# Patient Record
Sex: Male | Born: 1990 | Race: Black or African American | Hispanic: No | Marital: Single | Smoking: Current every day smoker
Health system: Southern US, Community
[De-identification: ages and names within clinical notes are randomized; demographics above are authoritative.]

## PROBLEM LIST (undated history)

## (undated) DIAGNOSIS — J45909 Unspecified asthma, uncomplicated: Secondary | ICD-10-CM

## (undated) HISTORY — DX: Unspecified asthma, uncomplicated: J45.909

---
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
## 2011-01-12 ENCOUNTER — Ambulatory Visit: Payer: Self-pay | Admitting: Family Medicine

## 2015-07-27 ENCOUNTER — Emergency Department: Payer: BLUE CROSS/BLUE SHIELD

## 2015-07-27 ENCOUNTER — Encounter: Payer: Self-pay | Admitting: Emergency Medicine

## 2015-07-27 ENCOUNTER — Emergency Department
Admission: EM | Admit: 2015-07-27 | Discharge: 2015-07-27 | Disposition: A | Discharge status: Home / Self Care | Payer: BLUE CROSS/BLUE SHIELD | Attending: Emergency Medicine | Admitting: Emergency Medicine

## 2015-07-27 DIAGNOSIS — L02416 Cutaneous abscess of left lower limb: Secondary | ICD-10-CM | POA: Diagnosis present

## 2015-07-27 DIAGNOSIS — L03116 Cellulitis of left lower limb: Secondary | ICD-10-CM | POA: Insufficient documentation

## 2015-07-27 DIAGNOSIS — Z720 Tobacco use: Secondary | ICD-10-CM | POA: Insufficient documentation

## 2015-07-27 MED ORDER — SULFAMETHOXAZOLE-TRIMETHOPRIM 800-160 MG PO TABS: 1.0000 | ORAL_TABLET | Freq: Two times a day (BID) | ORAL | Status: DC

## 2015-07-27 MED ORDER — IBUPROFEN 800 MG PO TABS: 800.0000 mg | ORAL_TABLET | Freq: Three times a day (TID) | ORAL | Status: DC

## 2015-07-27 NOTE — ED Provider Notes (Signed)
[facility] Emergency Department Provider Note  ____________________________________________  Time seen:  4:23 PM  I have reviewed the triage vital signs and the nursing notes.   HISTORY  Chief Complaint Abscess   HPI Farrel Guimond is a 24 y.o. male is here with complaint of left lower leg pain 1 month. He states that he may have "spider bite" to his leg but did not actually see it. He states the area has been swollen and warm to touch for several days. He is not taking any medication for it and this is the first time he's been seen for this complaint. He denies any previous injury to his leg. He denies any fever or chills, nausea or vomiting. He rates his pain is 7 out of 10.   History reviewed. No pertinent past medical history.  There are no active problems to display for this patient.   History reviewed. No pertinent past surgical history.  Current Outpatient Rx  Name  Route  Sig  Dispense  Refill  . ibuprofen (ADVIL,MOTRIN) 800 MG tablet   Oral   Take 1 tablet (800 mg total) by mouth 3 (three) times daily.   30 tablet   0   . sulfamethoxazole-trimethoprim (BACTRIM DS,SEPTRA DS) 800-160 MG per tablet   Oral   Take 1 tablet by mouth 2 (two) times daily.   20 tablet   0     Allergies Review of patient's allergies indicates no known allergies.  No family history on file.  Social History History  Substance Use Topics  . Smoking status: Current Every Day Smoker    Types: Cigarettes  . Smokeless tobacco: Not on file  . Alcohol Use: 1.2 oz/week    2 Cans of beer per week    Review of Systems Constitutional: No fever/chills ENT: No sore throat. Cardiovascular: Denies chest pain. Respiratory: Denies shortness of breath. Gastrointestinal: No abdominal pain.  No nausea, no vomiting Genitourinary: Negative for dysuria. Musculoskeletal: Negative for back pain. Skin: Negative for rash. Neurological: Negative for headaches, focal  weakness or numbness.  10-point ROS otherwise negative.  ____________________________________________   PHYSICAL EXAM:  VITAL SIGNS: ED Triage Vitals  Enc Vitals Group     BP 07/27/15 1559 146/87 mmHg     Pulse Rate 07/27/15 1559 98     Resp 07/27/15 1559 16     Temp 07/27/15 1559 98.2 F (36.8 C)     Temp Source 07/27/15 1559 Oral     SpO2 07/27/15 1559 98 %     Weight 07/27/15 1559 220 lb (99.791 kg)     Height 07/27/15 1559 5\' 10"  (1.778 m)     Head Cir --      Peak Flow --      Pain Score 07/27/15 1559 7     Pain Loc --      Pain Edu? --      Excl. in GC? --     Constitutional: Alert and oriented. Well appearing and in no acute distress. Eyes: Conjunctivae are normal. PERRL. EOMI. Head: Atraumatic. Nose: No congestion/rhinnorhea. Neck: No stridor.   Cardiovascular: Normal rate, regular rhythm. Grossly normal heart sounds.  Good peripheral circulation. Respiratory: Normal respiratory effort.  No retractions. Lungs CTAB. Gastrointestinal: Soft and nontender. No distention. Musculoskeletal: No lower extremity tenderness nor edema.  No joint effusions. We'll aspect of the left lower leg, there is a very firm area measuring approximately 8 cm without fluctuance. Area is warm to touch and tender. Neurologic Normal speech  and language. No gross focal neurologic deficits are appreciated. No gait instability. Skin:  Skin is warm, dry and intact. No rash noted. Psychiatric: Mood and affect are normal. Speech and behavior are normal.  ____________________________________________   LABS (all labs ordered are listed, but only abnormal results are displayed)  Labs Reviewed - No data to display  RADIOLOGY  X-ray of left tib-fib shows no bony abnormality or foreign body. I, Tommi Rumps, personally viewed and evaluated these images as part of my medical decision making.  ____________________________________________   PROCEDURES  Procedure(s) performed:  None  Critical Care performed: No  ____________________________________________   INITIAL IMPRESSION / ASSESSMENT AND PLAN / ED COURSE  Pertinent labs & imaging results that were available during my care of the patient were reviewed by me and considered in my medical decision making (see chart for details  at this time we will treat for cellulitis. Patient was placed on Septra DS for infection and ibuprofen as needed for pain and inflammation. He will use warm compresses to the area and if any fever, chills or severe worsening he is to return to the emergency room.   FINAL CLINICAL IMPRESSION(S) / ED DIAGNOSES  Final diagnoses:  Cellulitis of leg without foot, left      Tommi Rumps, PA-C 07/27/15 1735  Myrna Blazer, MD 07/27/15 [phone]

## 2015-07-27 NOTE — Discharge Instructions (Signed)

## 2015-07-27 NOTE — ED Notes (Signed)
Pt reports "spider bite" to left lower leg x1 month. Area is swollen and warm to touch.

## 2016-01-17 IMAGING — CR DG TIBIA/FIBULA 2V*L*
1 series · 4 of 4 positions shown · non-contrast
Comparison: None.

CLINICAL DATA: Pain in mid calf since spider bite 1 month ago

EXAM:
LEFT TIBIA AND FIBULA - 2 VIEW

[Series 1: ap · 0.17mm/px · 4 of 4 slices shown]
[im 1/4]
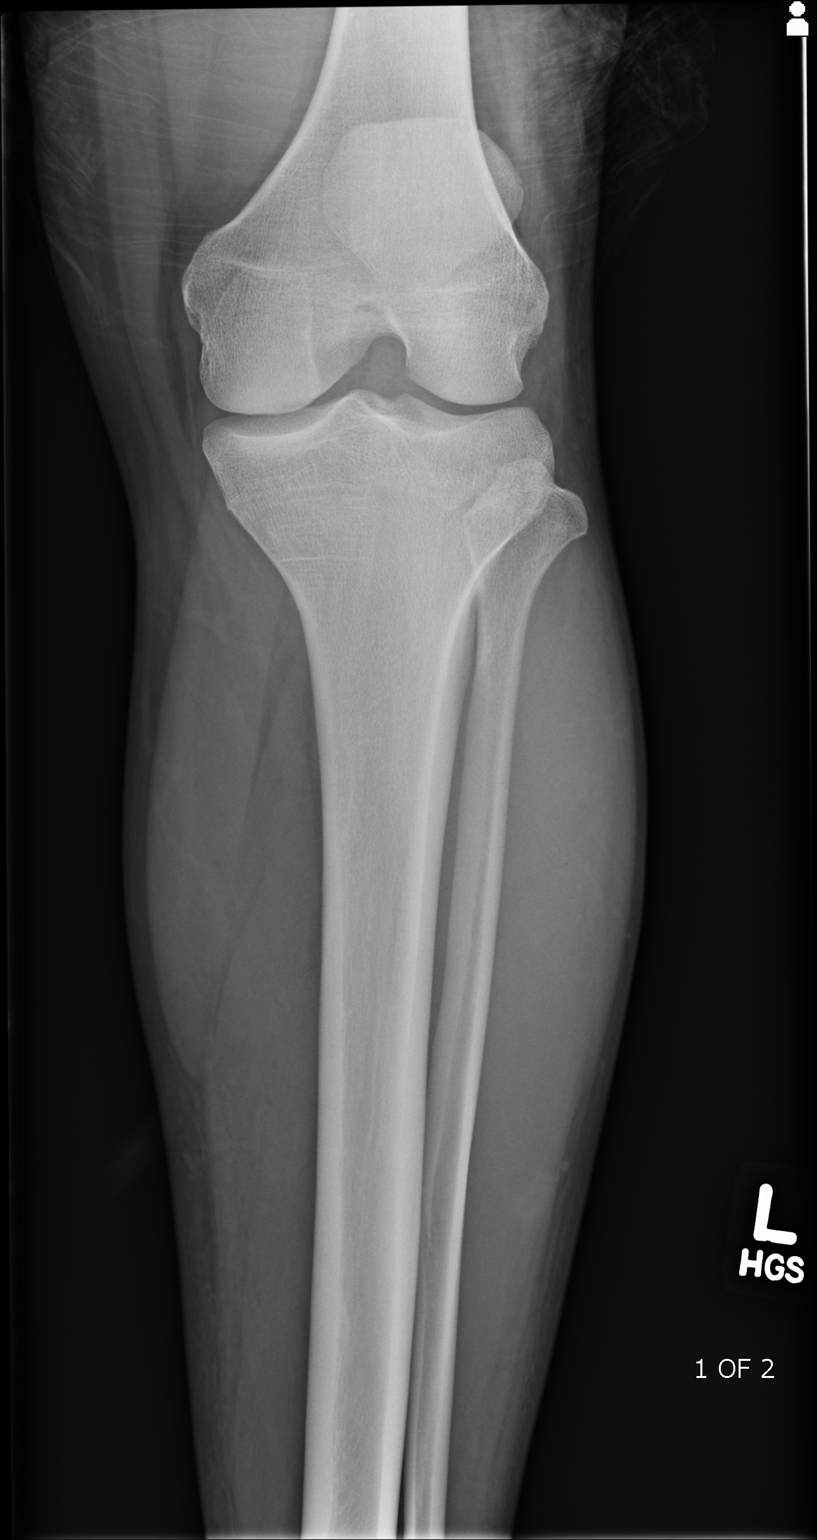
[im 2/4]
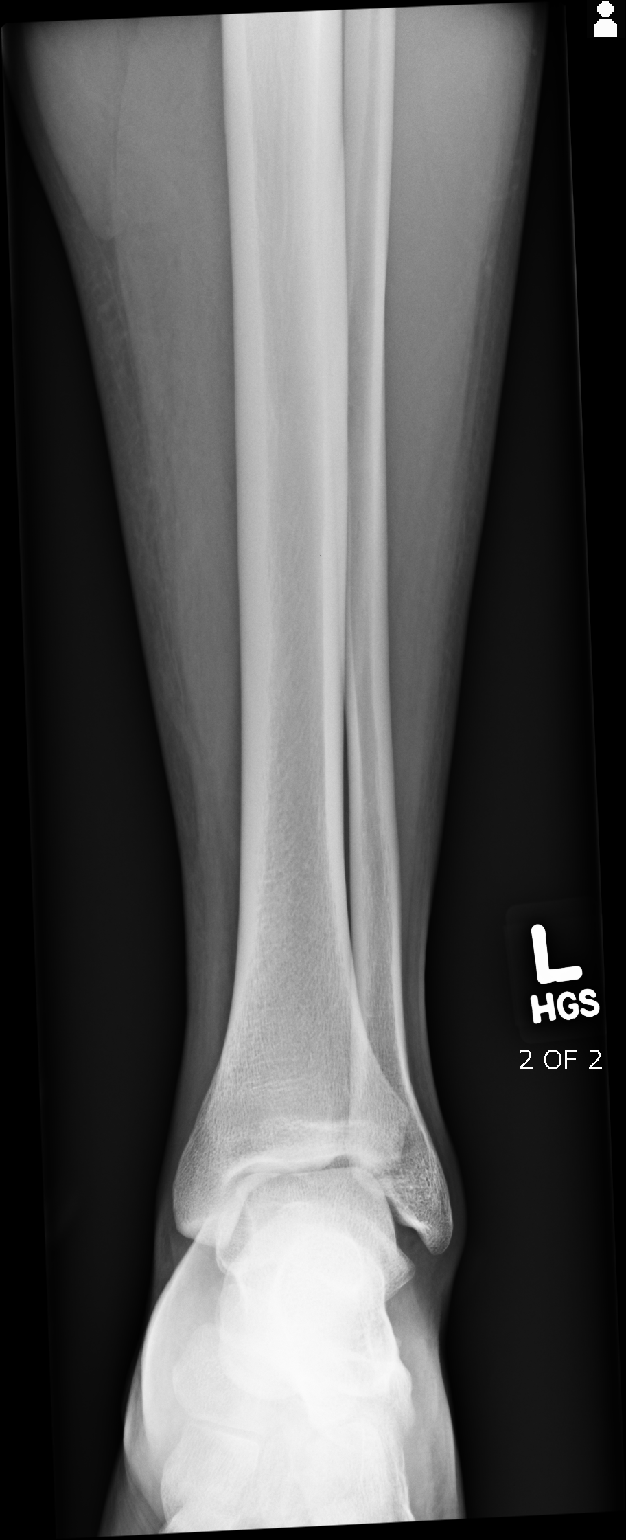
[im 3/4]
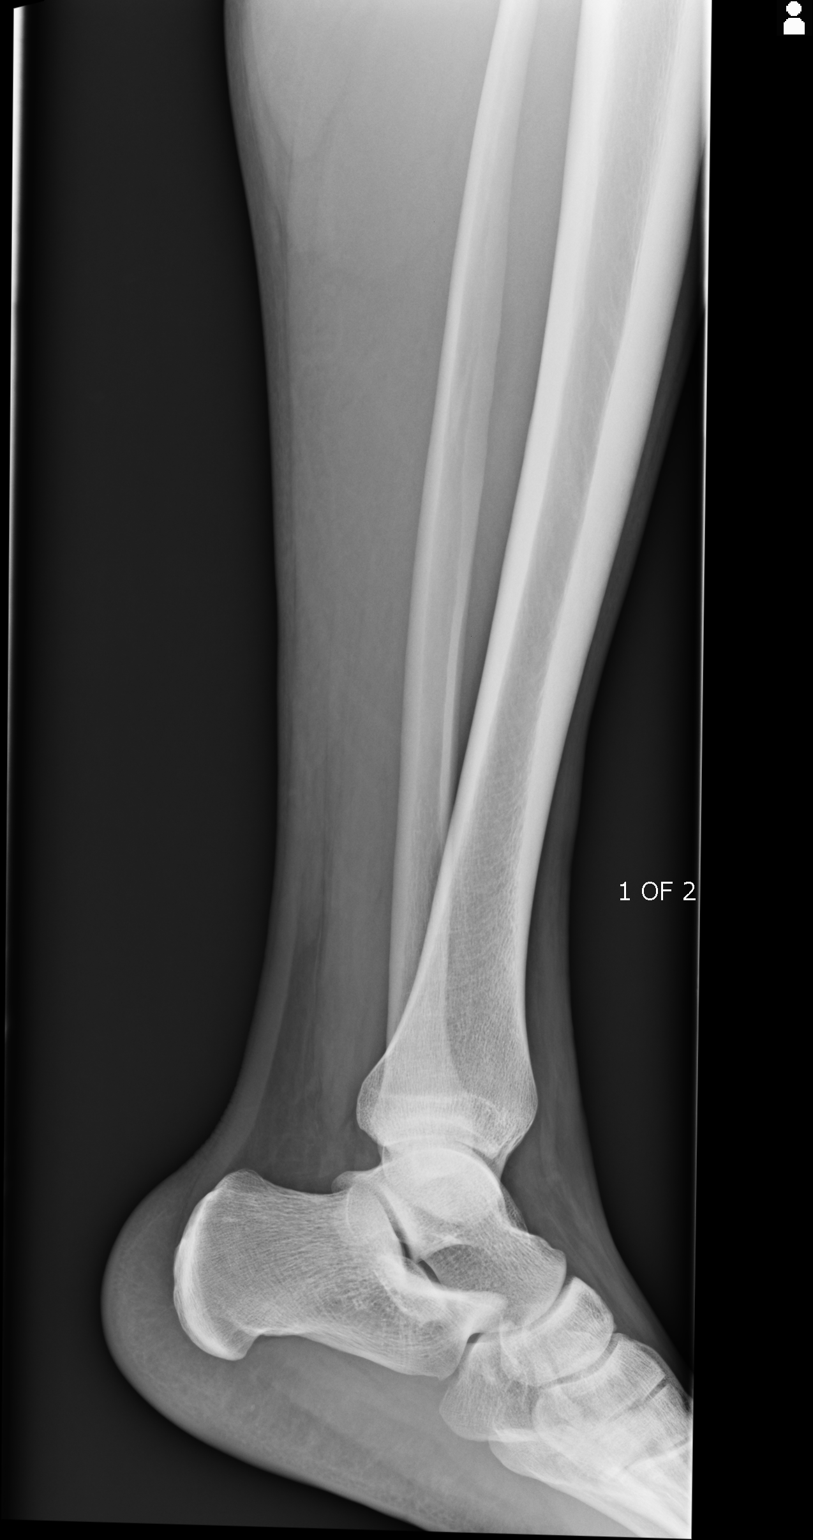
[im 4/4]
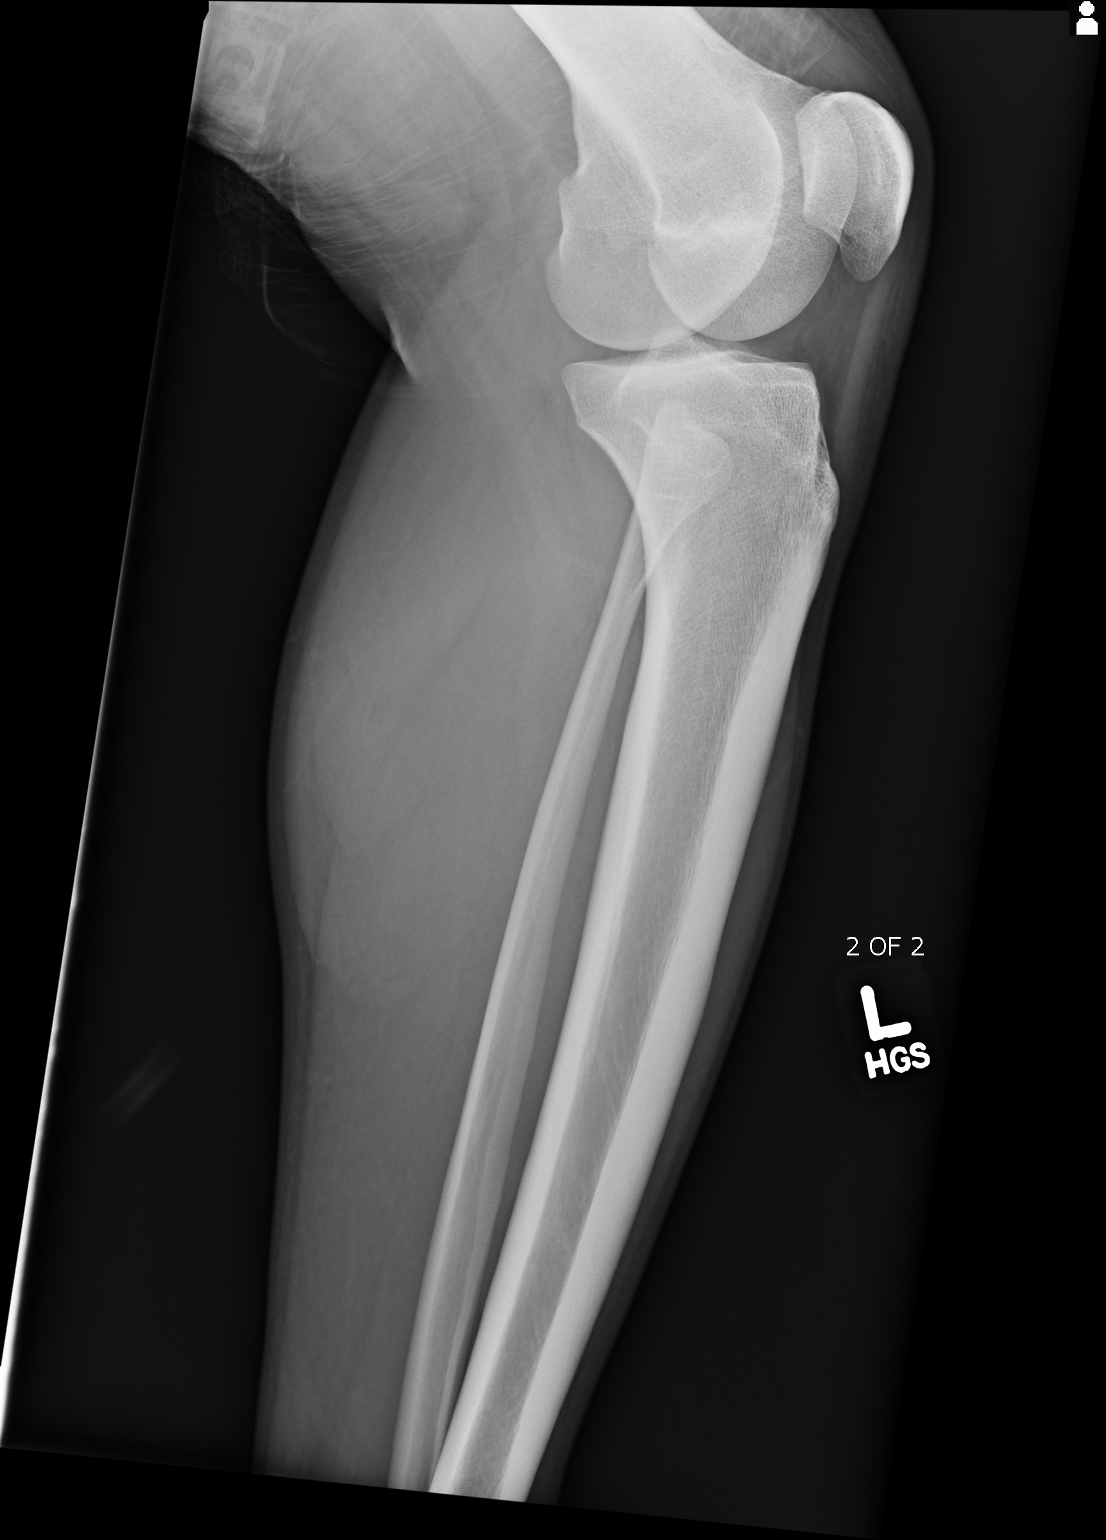

[4 of 4 positions shown; findings below may reference images not displayed]

FINDINGS: No fracture or dislocation is seen.

The joint spaces are preserved.

The visualized soft tissues are unremarkable.

No radiopaque foreign body is seen.
IMPRESSION: No acute osseous abnormality is seen.

## 2018-12-29 DIAGNOSIS — F419 Anxiety disorder, unspecified: Secondary | ICD-10-CM | POA: Insufficient documentation

## 2018-12-29 DIAGNOSIS — E7800 Pure hypercholesterolemia, unspecified: Secondary | ICD-10-CM | POA: Insufficient documentation

## 2019-02-27 DIAGNOSIS — N451 Epididymitis: Secondary | ICD-10-CM | POA: Insufficient documentation

## 2019-02-27 HISTORY — DX: Epididymitis: N45.1

## 2023-07-19 ENCOUNTER — Ambulatory Visit (INDEPENDENT_AMBULATORY_CARE_PROVIDER_SITE_OTHER): Payer: 59 | Admitting: Family Medicine

## 2023-07-19 ENCOUNTER — Encounter: Payer: Self-pay | Admitting: Emergency Medicine

## 2023-07-19 ENCOUNTER — Emergency Department: Payer: 59

## 2023-07-19 ENCOUNTER — Other Ambulatory Visit: Payer: Self-pay

## 2023-07-19 ENCOUNTER — Emergency Department
Admission: EM | Admit: 2023-07-19 | Discharge: 2023-07-19 | Disposition: A | Discharge status: Home / Self Care | Payer: 59 | Attending: Emergency Medicine | Admitting: Emergency Medicine

## 2023-07-19 ENCOUNTER — Encounter: Payer: Self-pay | Admitting: Family Medicine

## 2023-07-19 VITALS — BP 144/88 | HR 91 | Temp 98.6°F | Ht 70.0 in | Wt 222.0 lb

## 2023-07-19 DIAGNOSIS — Z7689 Persons encountering health services in other specified circumstances: Secondary | ICD-10-CM

## 2023-07-19 DIAGNOSIS — R9431 Abnormal electrocardiogram [ECG] [EKG]: Secondary | ICD-10-CM

## 2023-07-19 DIAGNOSIS — R072 Precordial pain: Secondary | ICD-10-CM

## 2023-07-19 DIAGNOSIS — F321 Major depressive disorder, single episode, moderate: Secondary | ICD-10-CM

## 2023-07-19 DIAGNOSIS — F17200 Nicotine dependence, unspecified, uncomplicated: Secondary | ICD-10-CM | POA: Diagnosis not present

## 2023-07-19 DIAGNOSIS — R079 Chest pain, unspecified: Secondary | ICD-10-CM | POA: Diagnosis present

## 2023-07-19 DIAGNOSIS — F339 Major depressive disorder, recurrent, unspecified: Secondary | ICD-10-CM

## 2023-07-19 DIAGNOSIS — R030 Elevated blood-pressure reading, without diagnosis of hypertension: Secondary | ICD-10-CM

## 2023-07-19 LAB — CBC
HCT: 44.1 % (ref 39.0–52.0)
Hemoglobin: 14.5 g/dL (ref 13.0–17.0)
MCH: 27.6 pg (ref 26.0–34.0)
MCHC: 32.9 g/dL (ref 30.0–36.0)
MCV: 83.8 fL (ref 80.0–100.0)
Platelets: 280 10*3/uL (ref 150–400)
RBC: 5.26 MIL/uL (ref 4.22–5.81)
RDW: 14.1 % (ref 11.5–15.5)
WBC: 7.1 10*3/uL (ref 4.0–10.5)
nRBC: 0 % (ref 0.0–0.2)

## 2023-07-19 LAB — BASIC METABOLIC PANEL
Anion gap: 9 (ref 5–15)
BUN: 14 mg/dL (ref 6–20)
CO2: 27 mmol/L (ref 22–32)
Calcium: 9.6 mg/dL (ref 8.9–10.3)
Chloride: 100 mmol/L (ref 98–111)
Creatinine, Ser: 1.1 mg/dL (ref 0.61–1.24)
GFR, Estimated: >60 mL/min (ref >60)
Glucose, Bld: 103 mg/dL — ABNORMAL HIGH (ref 70–99)
Potassium: 3.7 mmol/L (ref 3.5–5.1)
Sodium: 136 mmol/L (ref 135–145)

## 2023-07-19 LAB — TROPONIN I (HIGH SENSITIVITY): Troponin I (High Sensitivity): 6 ng/L (ref <18)

## 2023-07-19 MED ORDER — BUPROPION HCL ER (SR) 150 MG PO TB12: ORAL_TABLET | ORAL | 0 refills | Status: DC

## 2023-07-19 NOTE — ED Triage Notes (Signed)
Patient to ED via POV for chest pain- none at this time though. Last had CP last week. Came to ED due to PCP sending him due to abnormal EKG and showing signs of "a pre-heart attack." NAD noted.

## 2023-07-19 NOTE — Progress Notes (Unsigned)
New patient visit   Patient: Justin Small   DOB: [birth year]   31 y.o. Male  MRN: [id number] Visit Date: 07/19/2023  Today's healthcare provider: Sherlyn Hay, DO   Chief Complaint  Patient presents with   Establish Care   Chest Pain    Patient states for the last 6 months he has been having episodes weekly of left sided chest pain that he describes as sharp without any radiation.  He does relate being short of breath during these episodes but denies any diaphoresis, nausea or other associated symptoms.   Asthma    Patient states he has had asthma since being a child.  He also admits to smoking.  He reports that he has trouble breathing most of the time.   Nicotine Dependence    Patient is ready to quit smoking and would like to discuss treatment options.   Subjective    Justin Small is a 32 y.o. male who presents today as a new patient to establish care.  HPI HPI     Chest Pain    Additional comments: Patient states for the last 6 months he has been having episodes weekly of left sided chest pain that he describes as sharp without any radiation.  He does relate being short of breath during these episodes but denies any diaphoresis, nausea or other associated symptoms.        Asthma    Additional comments: Patient states he has had asthma since being a child.  He also admits to smoking.  He reports that he has trouble breathing most of the time.        Nicotine Dependence    Additional comments: Patient is ready to quit smoking and would like to discuss treatment options.      Last edited by Adline Peals, CMA on 07/19/2023  3:34 PM.      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 Does eat in between.  - he states it last occurred last week  - his wife states he complained of his chest hurting and put his hand to his chest the other day.  Asthma:  - Short of breath most of the time  Nicotine:  - previously  tried to quit cold [place], lasted a week  - Has not tried other methods   Past Medical History:  Diagnosis Date   Asthma    Epididymitis, left 02/27/2019   History reviewed. No pertinent surgical history. Family Status  Relation Name Status   Mother  (Not Specified)   Father  (Not Specified)   MGM  (Not Specified)   PGM  (Not Specified)  No partnership data on file   Family History  Problem Relation Age of Onset   Sleep apnea Mother    Hypertension Father    Heart disease Maternal Grandmother    Cancer Paternal Grandmother    Social History   Socioeconomic History   Marital status: Single    Spouse name: Not on file   Number of children: Not on file   Years of education: Not on file   Highest education level: Not on file  Occupational History   Not on file  Tobacco Use   Smoking status: Every Day    Types: Cigarettes    Passive exposure: Current   Smokeless tobacco: Never  Vaping Use   Vaping status: Every Day  Substance and Sexual Activity   Alcohol  use: Not Currently   Drug use: No   Sexual activity: Yes    Birth control/protection: Inserts  Other Topics Concern   Not on file  Social History Narrative   Not on file   Social Determinants of Health   Financial Resource Strain: Not on file  Food Insecurity: No Food Insecurity (07/19/2023)   Hunger Vital Sign    Worried About Running Out of Food in the Last Year: Never true    Ran Out of Food in the Last Year: Never true  Transportation Needs: No Transportation Needs (07/19/2023)   PRAPARE - Administrator, Civil Service (Medical): No    Lack of Transportation (Non-Medical): No  Physical Activity: Not on file  Stress: Not on file  Social Connections: Not on file   Outpatient Medications Prior to Visit  Medication Sig   ibuprofen (ADVIL,MOTRIN) 800 MG tablet Take 1 tablet (800 mg total) by mouth 3 (three) times daily.   sulfamethoxazole-trimethoprim (BACTRIM DS,SEPTRA DS) 800-160 MG per  tablet Take 1 tablet by mouth 2 (two) times daily.   No facility-administered medications prior to visit.   Allergies  Allergen Reactions   Penicillins Swelling     There is no immunization history on file for this patient.  Health Maintenance  Topic Date Due   HIV Screening  Never done   Hepatitis C Screening  Never done   DTaP/Tdap/Td (1 - Tdap) Never done   COVID-19 Vaccine (1 - 2023-24 season) Never done   INFLUENZA VACCINE  07/21/2023   HPV VACCINES  Aged Out    Patient Care Team: Nalina Yeatman, Monico Blitz, DO as PCP - General (Family Medicine)  Review of Systems  Constitutional:  Negative for appetite change, chills and fever.  Respiratory:  Positive for shortness of breath ("all the time" but not presently). Negative for cough, chest tightness and wheezing.   Cardiovascular:  Positive for chest pain. Negative for palpitations.  Gastrointestinal:  Negative for abdominal pain, nausea and vomiting.  Psychiatric/Behavioral:  The patient is not nervous/anxious.        + depressed mood         Objective    BP (!) 144/88 (BP Location: Left Arm, Patient Position: Sitting, Cuff Size: Normal)   Pulse 91   Temp 98.6 F (37 C) (Oral)   Ht 5\' 10"  (1.778 m)   Wt 222 lb (100.7 kg)   SpO2 98%   BMI 31.85 kg/m      Physical Exam Constitutional:      Appearance: Normal appearance.  HENT:     Head: Normocephalic and atraumatic.  Eyes:     General: No scleral icterus.    Extraocular Movements: Extraocular movements intact.     Conjunctiva/sclera: Conjunctivae normal.  Cardiovascular:     Rate and Rhythm: Normal rate and regular rhythm.     Pulses: Normal pulses.     Heart sounds: Normal heart sounds.  Pulmonary:     Effort: Pulmonary effort is normal. No respiratory distress.     Breath sounds: Normal breath sounds.  Chest:     Chest wall: No tenderness.  Abdominal:     General: Bowel sounds are normal. There is no distension.     Palpations: Abdomen is soft. There  is no mass.     Tenderness: There is no abdominal tenderness. There is no guarding.  Musculoskeletal:     Right lower leg: No edema.     Left lower leg: No edema.  Skin:  General: Skin is warm and dry.  Neurological:     Mental Status: He is alert and oriented to person, place, and time. Mental status is at baseline.  Psychiatric:        Mood and Affect: Mood normal.        Behavior: Behavior normal.     Depression Screen    07/19/2023    3:28 PM  PHQ 2/9 Scores  PHQ - 2 Score 3  PHQ- 9 Score 15     Assessment & Plan     Precordial pain Assessment & Plan: Patient endorsed frequent, regular episodes of precordial pain that he described as sharp and nonradiating, which have been occurring over the past 6 months.  Differentials include but are not limited to myocardial infarction, anxiety and acid reflux.  Did check an EKG today, which demonstrated ST elevation in the anteroseptal leads.  The patient does deny chest pain, shortness of breath or any other symptoms at this time.  However, I did recommend patient proceed to the emergency department for more urgent evaluation of this ST elevation than we are able to do in the clinic. If ER workup is negative and patient's Wellbutrin does not improve the occurrence of his chest pain, will consider medication for reflux at that time.  Orders: -     EKG 12-Lead  Nicotine dependence with current use Assessment & Plan: Patient did endorse a desire to quit nicotine use, specifically vaping, given his concha mitten depression, I recommended we try Wellbutrin, which I have prescribed as noted below.  I also recommended the patient use an unloaded vape to simulate the activity of vaping.  We did discuss the possibility of nicotine supplements.  However, given his recent episodes of chest pain, this is not a viable option at this time.  Will have patient follow-up in 4 weeks for recheck.  Orders: -     buPROPion HCl ER (SR); Take one 150 mg  orally once daily in the morning for 3 days, then increase to 150 mg orally twice daily, with at least 8 hours between doses; No more than two tablets per day.  Dispense: 57 tablet; Refill: 0  Depression, major, single episode, moderate (HCC) Assessment & Plan: Will address patient's depression with Wellbutrin as noted.  Offered to refer patient for therapy, which he is not interested in at this time.  Orders: -     buPROPion HCl ER (SR); Take one 150 mg orally once daily in the morning for 3 days, then increase to 150 mg orally twice daily, with at least 8 hours between doses; No more than two tablets per day.  Dispense: 57 tablet; Refill: 0  ST elevation on ECG  Establishing care with new doctor, encounter for  Depression, recurrent [facility]) Assessment & Plan: Will address patient's depression with Wellbutrin as noted.  Offered to refer patient for therapy, which he is not interested in at this time.    Return in about 4 weeks (around 08/16/2023) for Anx/Dep, nicotine.      I recommended we call emergency services to transport the patient to the emergency department for evaluation of his ST elevation to assess whether patient was actively having a heart attack, given his recent, frequent chest pains.  Patient declined, opting to go with his wife in their car to the emergency department, which is located around the block from the clinic.  Patient and his wife agreed to proceed directly to the emergency department from the clinic.  Gave  patient a copy of the ECG to take with him.   Sherlyn Hay, DO  [facility] [phone] (phone) [phone] (fax)  [facility]

## 2023-07-19 NOTE — ED Provider Notes (Signed)
[facility] Provider Note  Patient Contact: 6:15 PM (approximate)   History   Chest Pain   HPI  Justin Small is a 32 y.o. male who presents the emergency department complaining of intermittent chest pain for the last 6 months.  Patient states that it tends to happen when he is either stressed or at work.  Patient was seeing his primary care for an unrelated reason, mentioned the ongoing chest pain.  Reportedly patient states that his primary care told him that he had a "pre-heart attack" finding on his EKG and sent him to the emergency department.  He currently has no symptoms of chest pain, shortness of breath.  No cardiac history.  Patient has never seen cardiology for this complaint.     Physical Exam   Triage Vital Signs: ED Triage Vitals  Encounter Vitals Group     BP 07/19/23 1647 (!) 140/98     Systolic BP Percentile --      Diastolic BP Percentile --      Pulse Rate 07/19/23 1647 88     Resp 07/19/23 1647 18     Temp 07/19/23 1647 98.2 F (36.8 C)     Temp Source 07/19/23 1647 Oral     SpO2 07/19/23 1647 100 %     Weight 07/19/23 1646 225 lb (102.1 kg)     Height 07/19/23 1646 5\' 10"  (1.778 m)     Head Circumference --      Peak Flow --      Pain Score 07/19/23 1646 0     Pain Loc --      Pain Education --      Exclude from Growth Chart --     Most recent vital signs: Vitals:   07/19/23 1647  BP: (!) 140/98  Pulse: 88  Resp: 18  Temp: 98.2 F (36.8 C)  SpO2: 100%     General: Alert and in no acute distress.  Cardiovascular:  Good peripheral perfusion.  Normal S1 and S2 with no appreciable murmurs, rubs, gallops. Respiratory: Normal respiratory effort without tachypnea or retractions. Lungs CTAB. Good air entry to the bases with no decreased or absent breath sounds. Gastrointestinal: Bowel sounds 4 quadrants. Soft and nontender to palpation. No guarding or rigidity. No palpable masses. No distention.  Musculoskeletal: Full  range of motion to all extremities.  Neurologic:  No gross focal neurologic deficits are appreciated.  Skin:   No rash noted Other:   ED Results / Procedures / Treatments   Labs (all labs ordered are listed, but only abnormal results are displayed) Labs Reviewed  BASIC METABOLIC PANEL - Abnormal; Notable for the following components:      Result Value   Glucose, Bld 103 (*)    All other components within normal limits  CBC  TROPONIN I (HIGH SENSITIVITY)     EKG  ED ECG REPORT I, Delorise Royals Martine Bleecker,  personally viewed and interpreted this ECG.   Date: 07/19/2023  EKG Time: 1641 hrs.  Rate: 81 bpm  Rhythm: No previous EKG for comparison, normal sinus rhythm, sinus arrhythmia  Axis: Normal axis  Intervals:none  ST&T Change: Nonspecific T wave change in lead III, otherwise no ST elevation or depressions noted.  Normal sinus rhythm.  Sinus arrhythmia.    RADIOLOGY  I personally viewed, evaluated, and interpreted these images as part of my medical decision making, as well as reviewing the written report by the radiologist.  ED Provider Interpretation: No acute cardiopulmonary findings  on chest x-ray  DG Chest 2 View  Result Date: 07/19/2023 CLINICAL DATA:  cp EXAM: CHEST - 2 VIEW COMPARISON:  None Available. FINDINGS: There is a round faint opacity overlying the lateral left mid lung zone on the frontal projection which corresponds to a monitoring lead sticker on lateral film. Bilateral lung fields are otherwise clear. Bilateral costophrenic angles are clear. Normal cardio-mediastinal silhouette. No acute osseous abnormalities. The soft tissues are within normal limits. IMPRESSION: 1. No active cardiopulmonary disease. Electronically Signed   By: Jules Schick M.D.   On: 07/19/2023 17:18    PROCEDURES:  Critical Care performed: No  Procedures   MEDICATIONS ORDERED IN ED: Medications - No data to display   IMPRESSION / MDM / ASSESSMENT AND PLAN / ED COURSE  I  reviewed the triage vital signs and the nursing notes.                                 Differential diagnosis includes, but is not limited to, ACS/STEMI, arrhythmia, anxiety, PE, musculoskeletal chest pain   Patient's presentation is most consistent with acute presentation with potential threat to life or bodily function.   Patient's diagnosis is consistent with nonspecific chest pain.  Patient presents emergency department with intermittent chest pain over the last 6 months.  Patient was asymptomatic today, seen primary care for an unrelated reason and reportedly had a abnormal EKG findings.  Unable to see this EKG in our EMR.  Patient states he was told he was having a "preheart attack."  Today patient is not having any chest pain, exam is reassuring, troponin is in the normal range, EKG reveals sinus rhythm with sinus arrhythmia.  No evidence of ACS/STEMI.  Chest x-ray is reassuring.  Given the ongoing nature of his symptoms for the last 6 months, despite reassuring workup I do feel that patient would benefit from follow-up with cardiology.  Concerning signs and symptoms and return precautions discussed with the patient.  Again follow-up cardiology as recommended..  Patient is given ED precautions to return to the ED for any worsening or new symptoms.     FINAL CLINICAL IMPRESSION(S) / ED DIAGNOSES   Final diagnoses:  Nonspecific chest pain     Rx / DC Orders   ED Discharge Orders          Ordered    Ambulatory referral to Cardiology       Comments: If you have not heard from the Cardiology office within the next 72 hours please call [phone].   07/19/23 1840             Note:  This document was prepared using Dragon voice recognition software and may include unintentional dictation errors.   Racheal Patches, PA-C 07/19/23 1846    Corena Herter, MD 07/19/23 2032

## 2023-07-19 NOTE — Patient Instructions (Addendum)
  Bupropion: Begin therapy while still smoking and set a target quit date within the first 2 weeks of buPROPion use; continue treatment for 7 to 12 weeks; evaluate the benefit/risk of longer treatment (Extended-release (SR) tablet) take one 150 mg orally once daily in the morning for 3 days, then increase to 150 mg orally twice daily, with at least 8 hours between doses; No more than two tablets per day

## 2023-07-20 ENCOUNTER — Encounter: Payer: Self-pay | Admitting: Family Medicine

## 2023-07-20 DIAGNOSIS — R030 Elevated blood-pressure reading, without diagnosis of hypertension: Secondary | ICD-10-CM | POA: Insufficient documentation

## 2023-07-20 DIAGNOSIS — F339 Major depressive disorder, recurrent, unspecified: Secondary | ICD-10-CM | POA: Insufficient documentation

## 2023-07-20 DIAGNOSIS — R072 Precordial pain: Secondary | ICD-10-CM | POA: Insufficient documentation

## 2023-07-20 DIAGNOSIS — F17200 Nicotine dependence, unspecified, uncomplicated: Secondary | ICD-10-CM | POA: Insufficient documentation

## 2023-07-20 DIAGNOSIS — R9431 Abnormal electrocardiogram [ECG] [EKG]: Secondary | ICD-10-CM | POA: Insufficient documentation

## 2023-07-20 DIAGNOSIS — F17201 Nicotine dependence, unspecified, in remission: Secondary | ICD-10-CM | POA: Insufficient documentation

## 2023-07-20 NOTE — Assessment & Plan Note (Addendum)
Patient did endorse a desire to quit nicotine use, specifically vaping, given his concha mitten depression, I recommended we try Wellbutrin, which I have prescribed as noted below.  I also recommended the patient use an unloaded vape to simulate the activity of vaping.  We did discuss the possibility of nicotine supplements.  However, given his recent episodes of chest pain, this is not a viable option at this time.  Will have patient follow-up in 4 weeks for recheck.

## 2023-07-20 NOTE — Assessment & Plan Note (Signed)
Will address patient's depression with Wellbutrin as noted.  Offered to refer patient for therapy, which he is not interested in at this time.

## 2023-07-20 NOTE — Assessment & Plan Note (Addendum)
Patient endorsed frequent, regular episodes of precordial pain that he described as sharp and nonradiating, which have been occurring over the past 6 months.  Differentials include but are not limited to myocardial infarction, anxiety and acid reflux.  Did check an EKG today, which demonstrated ST elevation in the anteroseptal leads.  The patient does deny chest pain, shortness of breath or any other symptoms at this time.  However, I did recommend patient proceed to the emergency department for more urgent evaluation of this ST elevation than we are able to do in the clinic. If ER workup is negative and patient's Wellbutrin does not improve the occurrence of his chest pain, will consider medication for reflux at that time.

## 2023-07-20 NOTE — Assessment & Plan Note (Signed)
Will have to follow-up at next visit due to ECG findings.

## 2023-07-24 ENCOUNTER — Other Ambulatory Visit: Payer: Self-pay | Admitting: Family Medicine

## 2023-07-24 DIAGNOSIS — F321 Major depressive disorder, single episode, moderate: Secondary | ICD-10-CM

## 2023-07-24 DIAGNOSIS — F17200 Nicotine dependence, unspecified, uncomplicated: Secondary | ICD-10-CM

## 2023-08-16 ENCOUNTER — Encounter: Payer: Self-pay | Admitting: Family Medicine

## 2023-08-16 ENCOUNTER — Ambulatory Visit (INDEPENDENT_AMBULATORY_CARE_PROVIDER_SITE_OTHER): Payer: 59 | Admitting: Family Medicine

## 2023-08-16 VITALS — BP 139/93 | HR 104 | Ht 70.0 in | Wt 220.7 lb

## 2023-08-16 DIAGNOSIS — F17200 Nicotine dependence, unspecified, uncomplicated: Secondary | ICD-10-CM | POA: Diagnosis not present

## 2023-08-16 DIAGNOSIS — R0681 Apnea, not elsewhere classified: Secondary | ICD-10-CM | POA: Diagnosis not present

## 2023-08-16 DIAGNOSIS — F419 Anxiety disorder, unspecified: Secondary | ICD-10-CM | POA: Diagnosis not present

## 2023-08-16 DIAGNOSIS — F32A Depression, unspecified: Secondary | ICD-10-CM

## 2023-08-16 DIAGNOSIS — G479 Sleep disorder, unspecified: Secondary | ICD-10-CM

## 2023-08-16 DIAGNOSIS — F321 Major depressive disorder, single episode, moderate: Secondary | ICD-10-CM

## 2023-08-16 MED ORDER — BUPROPION HCL ER (SR) 150 MG PO TB12: ORAL_TABLET | ORAL | 0 refills | Status: DC

## 2023-08-16 NOTE — Progress Notes (Unsigned)
Established patient visit   Patient: Justin Small   DOB: 11/09/1991   32 y.o. Male  MRN: [id number] Visit Date: 08/16/2023  Today's healthcare provider: Sherlyn Hay, DO   No chief complaint on file.  Subjective    HPI  PHQ-9 score: 15  AUDIT-C score: 0 Caffeine intake?  "A lot."  Drinking a hot coffee a day these days, but previously drank a lot of energy drinks and coffee in the winter. Iced coffees on the weekends.  Sleep habits?  - works both day and night; had sleep problems even when he only had one job  - goes to bed around 0000-0100, wakes up at 0430, then goes to 1st job (starts at 0600), followed by second job, gets off work around 0100-0200.  The first job is an 8-hour shift while the second one is frequently longer (would be 3-11 but he doesn't start until 1630 because he gets off the other job ends then  - when he tries to sleep in on the weekend and doesn't have his kids to care for then, he states he can sleep for 16-18 hours. He usually doesn't sleep that long. For a long stretch of sleeping, he wakes up and just goes back to sleep without problem.  - girlfriend says he slept for 24 hours one day.  - in all cases, he does not feel rested when he wakes up.  Snores very loudly.  Had sleep studies done - one was done when he was 32 or 32 years old and the other was sometime after that but before high school.  Had them done because he was falling asleep a lot. Doesn't ever wake up trying to catch his breath. Girlfriend hears him stop breathing, then start again.   {History (Optional):23778}  Medications: Outpatient Medications Prior to Visit  Medication Sig   [DISCONTINUED] buPROPion (WELLBUTRIN SR) 150 MG 12 hr tablet Take one 150 mg orally once daily in the morning for 3 days, then increase to 150 mg orally twice daily, with at least 8 hours between doses; No more than two tablets per day.   [DISCONTINUED] ibuprofen (ADVIL,MOTRIN) 800 MG tablet Take 1 tablet  (800 mg total) by mouth 3 (three) times daily.   [DISCONTINUED] sulfamethoxazole-trimethoprim (BACTRIM DS,SEPTRA DS) 800-160 MG per tablet Take 1 tablet by mouth 2 (two) times daily.   No facility-administered medications prior to visit.    Review of Systems  Constitutional:  Positive for fatigue. Negative for appetite change, chills and fever.  Respiratory:  Positive for shortness of breath. Negative for chest tightness and wheezing.   Cardiovascular:  Negative for chest pain and palpitations.  Gastrointestinal:  Negative for abdominal pain, nausea and vomiting.    {Insert previous labs (optional):23779} {See past labs  Heme  Chem  Endocrine  Serology  Results Review (optional):1}   Objective    BP (!) 139/93 (BP Location: Right Arm, Patient Position: Sitting, Cuff Size: Normal)   Pulse (!) 104   Ht 5\' 10"  (1.778 m)   Wt 220 lb 11.2 oz (100.1 kg)   SpO2 100%   BMI 31.67 kg/m  {Insert last BP/Wt (optional):23777}{See vitals history (optional):1}   Physical Exam Vitals reviewed.  Constitutional:      General: He is not in acute distress.    Appearance: Normal appearance. He is not diaphoretic.  HENT:     Head: Normocephalic and atraumatic.  Eyes:     General: No scleral icterus.    Conjunctiva/sclera:  Conjunctivae normal.  Cardiovascular:     Rate and Rhythm: Normal rate and regular rhythm.     Pulses: Normal pulses.     Heart sounds: Normal heart sounds. No murmur heard. Pulmonary:     Effort: Pulmonary effort is normal. No respiratory distress.     Breath sounds: Normal breath sounds. No wheezing or rhonchi.  Musculoskeletal:     Cervical back: Neck supple.  Lymphadenopathy:     Cervical: No cervical adenopathy.  Skin:    General: Skin is warm and dry.  Neurological:     Mental Status: He is alert and oriented to person, place, and time. Mental status is at baseline.  Psychiatric:        Mood and Affect: Mood normal.        Behavior: Behavior normal.       No results found for any visits on 08/16/23.  Assessment & Plan    Sleep disturbance  Anxiety and depression  Apnea -     Home sleep test; Future  Nicotine dependence with current use -     buPROPion HCl ER (SR); Take one 150 mg orally once daily in the morning for 3 days, then increase to 150 mg orally twice daily, with at least 8 hours between doses; No more than two tablets per day.  Dispense: 57 tablet; Refill: 0  Depression, major, single episode, moderate (HCC) -     buPROPion HCl ER (SR); Take one 150 mg orally once daily in the morning for 3 days, then increase to 150 mg orally twice daily, with at least 8 hours between doses; No more than two tablets per day.  Dispense: 57 tablet; Refill: 0     ***  Return in about 4 weeks (around 09/13/2023) for Anx/Dep, smoking cessation.      I discussed the assessment and treatment plan with the patient  The patient was provided an opportunity to ask questions and all were answered. The patient agreed with the plan and demonstrated an understanding of the instructions.   The patient was advised to call back or seek an in-person evaluation if the symptoms worsen or if the condition fails to improve as anticipated.    Sherlyn Hay, DO  [facility] [phone] (phone) [phone] (fax)  [facility]

## 2023-08-17 ENCOUNTER — Encounter: Payer: Self-pay | Admitting: Family Medicine

## 2023-08-17 DIAGNOSIS — R0681 Apnea, not elsewhere classified: Secondary | ICD-10-CM | POA: Insufficient documentation

## 2023-08-17 DIAGNOSIS — F321 Major depressive disorder, single episode, moderate: Secondary | ICD-10-CM | POA: Insufficient documentation

## 2023-08-17 DIAGNOSIS — G479 Sleep disorder, unspecified: Secondary | ICD-10-CM | POA: Insufficient documentation

## 2023-08-17 NOTE — Assessment & Plan Note (Signed)
As above.

## 2023-08-17 NOTE — Assessment & Plan Note (Addendum)
Patient has longstanding history of snoring/breathing issues while sleeping, and his girlfriend notes that he does stop breathing intermittently while sleeping.  Patient also notes that his sleep is not refreshing and that he does not ever wake up feeling as though he has slept well.  He notes waking up with a headache approximately once per week.  Given these factors, will go ahead and order a home sleep apnea study.

## 2023-08-17 NOTE — Assessment & Plan Note (Signed)
Patient does have anxiety and depression with associated, intermittent episodes of shortness of breath and nonsimultaneous episodes of chest pain as noted previously.  We will be starting him on Wellbutrin for smoking cessation.  However, this will likely benefit his anxiety and depression as well.  Will reevaluate on next visit.

## 2023-08-17 NOTE — Assessment & Plan Note (Signed)
Patient did not receive the Wellbutrin that was sent at the last visit.  Will resend this in and follow-up in 4 weeks to see how he is doing and quitting smoking (specifically vaping).  Counseled patient on coping mechanisms to avoid smoking as well.

## 2023-08-17 NOTE — Assessment & Plan Note (Signed)
Patient endorses having nonrestful sleep on a regular basis.  He does not have difficulty initiating sleep or maintaining sleep, except to note that he does wake up early on the weekends; he is able to go back to sleep without problem though.  His early wake ups on the weekends are likely attributable to his normal extremely early wake-up time during the week of 0430.  Given the demands of his home and work life, he frequently goes to sleep very late and wakes up at this very early hour.  Discussed with him that the best way to resolve these particular sleep problems and his fatigue is going to be finding a better job, though I acknowledge that his easier said than done.  However, his current circumstance with 2 full-time jobs is not sustainable in the long run.

## 2023-09-20 ENCOUNTER — Ambulatory Visit: Payer: 59 | Admitting: Family Medicine

## 2023-09-20 VITALS — BP 133/86 | HR 93 | Temp 98.5°F | Ht 70.0 in | Wt 220.0 lb

## 2023-09-20 DIAGNOSIS — F339 Major depressive disorder, recurrent, unspecified: Secondary | ICD-10-CM

## 2023-09-20 DIAGNOSIS — F17291 Nicotine dependence, other tobacco product, in remission: Secondary | ICD-10-CM

## 2023-09-20 DIAGNOSIS — F419 Anxiety disorder, unspecified: Secondary | ICD-10-CM

## 2023-09-20 DIAGNOSIS — Z0000 Encounter for general adult medical examination without abnormal findings: Secondary | ICD-10-CM | POA: Diagnosis not present

## 2023-09-20 DIAGNOSIS — R030 Elevated blood-pressure reading, without diagnosis of hypertension: Secondary | ICD-10-CM

## 2023-09-20 DIAGNOSIS — F32A Depression, unspecified: Secondary | ICD-10-CM

## 2023-09-20 DIAGNOSIS — R0681 Apnea, not elsewhere classified: Secondary | ICD-10-CM

## 2023-09-20 DIAGNOSIS — Z23 Encounter for immunization: Secondary | ICD-10-CM

## 2023-09-20 DIAGNOSIS — J4520 Mild intermittent asthma, uncomplicated: Secondary | ICD-10-CM

## 2023-09-20 DIAGNOSIS — R000 Tachycardia, unspecified: Secondary | ICD-10-CM

## 2023-09-20 MED ORDER — ALBUTEROL SULFATE HFA 108 (90 BASE) MCG/ACT IN AERS: 2.0000 | INHALATION_SPRAY | Freq: Four times a day (QID) | RESPIRATORY_TRACT | 2 refills | Status: DC | PRN

## 2023-09-20 NOTE — Assessment & Plan Note (Signed)
Well-controlled today Continue Bupropion 150mg  bid Patient denies need for refills and will request through pharmacy.

## 2023-09-20 NOTE — Progress Notes (Signed)
Complete physical exam   Patient: Justin Small   DOB: [birth year]   32 y.o. Male  MRN: [id number] Visit Date: 09/20/2023  Today's healthcare provider: Sherlyn Hay, DO   Chief Complaint  Patient presents with   Depression    Patient has adjusted to his medication and feels he is starting to do better   Nicotine Dependence    Patient has quit smoking.   Subjective    Justin Small is a 32 y.o. male who presents today for a complete physical exam.  He reports consuming a general and processed-foods  diet. The patient has a physically strenuous job, but has no regular exercise apart from work.  He generally feels fairly well. He reports sleeping fairly well. He does not have additional problems to discuss today.  HPI HPI     Depression    Additional comments: Patient has adjusted to his medication and feels he is starting to do better        Nicotine Dependence    Additional comments: Patient has quit smoking.      Last edited by Adline Peals, CMA on 09/20/2023  3:59 PM.      Tachycardic during previous visit Sleep study done? Yes, sent back to company. ?Hx COPD - not as sure asthma - yes, remote  Tobacco - has stopped completely for the past 2-3 weeks  Still having chest pain,  - sometimes lasts for 10 to 20 minutes - sometimes lasts all day  - Woke up from sleep the other day with chest pain (precordial, anterior to apex  - does have appointment with cardiologist  - no nausea, vomiting, shortness of breath or diaphoresis associated    Past Medical History:  Diagnosis Date   Asthma    Epididymitis, left 02/27/2019   No past surgical history on file. Social History   Socioeconomic History   Marital status: Single    Spouse name: Not on file   Number of children: Not on file   Years of education: Not on file   Highest education level: 12th grade  Occupational History   Not on file  Tobacco Use   Smoking status: Every Day    Types:  Cigarettes    Passive exposure: Current   Smokeless tobacco: Never  Vaping Use   Vaping status: Every Day  Substance and Sexual Activity   Alcohol use: Not Currently   Drug use: No   Sexual activity: Yes    Birth control/protection: Inserts  Other Topics Concern   Not on file  Social History Narrative   Not on file   Social Determinants of Health   Financial Resource Strain: Low Risk  (08/15/2023)   Overall Financial Resource Strain (CARDIA)    Difficulty of Paying Living Expenses: Not hard at all  Food Insecurity: No Food Insecurity (08/15/2023)   Hunger Vital Sign    Worried About Running Out of Food in the Last Year: Never true    Ran Out of Food in the Last Year: Never true  Transportation Needs: No Transportation Needs (08/15/2023)   PRAPARE - Administrator, Civil Service (Medical): No    Lack of Transportation (Non-Medical): No  Physical Activity: Unknown (08/15/2023)   Exercise Vital Sign    Days of Exercise per Week: 0 days    Minutes of Exercise per Session: Not on file  Stress: No Stress Concern Present (08/15/2023)   [employer] of Occupational Health - Occupational Stress Questionnaire  Feeling of Stress : Only a little  Social Connections: Moderately Isolated (08/15/2023)   Social Connection and Isolation Panel [NHANES]    Frequency of Communication with Friends and Family: Once a week    Frequency of Social Gatherings with Friends and Family: Once a week    Attends Religious Services: 1 to 4 times per year    Active Member of [employer] or Organizations: No    Attends Engineer, structural: Not on file    Marital Status: Married  Catering manager Violence: Not on file   Family Status  Relation Name Status   Mother  (Not Specified)   Father  (Not Specified)   MGM  (Not Specified)   PGM  (Not Specified)  No partnership data on file   Family History  Problem Relation Age of Onset   Sleep apnea Mother    Hypertension Father    Heart  disease Maternal Grandmother    Cancer Paternal Grandmother    Allergies  Allergen Reactions   Amoxicillin Swelling   Ibuprofen Swelling   Trazodone And Nefazodone Swelling    Throat swelling   Penicillins Swelling    Patient Care Team: Marris Frontera N, DO as PCP - General (Family Medicine)   Medications: Outpatient Medications Prior to Visit  Medication Sig   buPROPion (WELLBUTRIN SR) 150 MG 12 hr tablet Take one 150 mg orally once daily in the morning for 3 days, then increase to 150 mg orally twice daily, with at least 8 hours between doses; No more than two tablets per day.   No facility-administered medications prior to visit.    Review of Systems  Constitutional:  Negative for chills, fatigue and fever.  Respiratory: Negative.  Negative for cough, shortness of breath and wheezing.   Cardiovascular:  Positive for chest pain (intermittently; worse with movement). Negative for palpitations and leg swelling.  Gastrointestinal:  Negative for abdominal pain, constipation, diarrhea, nausea and vomiting.  Neurological:  Negative for weakness and headaches.        Objective    BP 133/86 (BP Location: Right Arm, Patient Position: Sitting, Cuff Size: Large)   Pulse 93   Temp 98.5 F (36.9 C) (Oral)   Ht 5\' 10"  (1.778 m)   Wt 220 lb (99.8 kg)   SpO2 97%   BMI 31.57 kg/m    Physical Exam Vitals and nursing note reviewed.  Constitutional:      General: He is awake.     Appearance: Normal appearance.  HENT:     Head: Normocephalic and atraumatic.     Right Ear: Tympanic membrane, ear canal and external ear normal.     Left Ear: Tympanic membrane, ear canal and external ear normal.     Nose: Nose normal.     Mouth/Throat:     Mouth: Mucous membranes are moist.     Pharynx: Oropharynx is clear. No oropharyngeal exudate or posterior oropharyngeal erythema.  Eyes:     General: No scleral icterus.    Extraocular Movements: Extraocular movements intact.      Conjunctiva/sclera: Conjunctivae normal.     Pupils: Pupils are equal, round, and reactive to light.  Neck:     Thyroid: No thyromegaly or thyroid tenderness.  Cardiovascular:     Rate and Rhythm: Normal rate and regular rhythm.     Pulses: Normal pulses.     Heart sounds: Normal heart sounds.  Pulmonary:     Effort: Pulmonary effort is normal. No tachypnea, bradypnea or respiratory distress.  Breath sounds: No stridor. Examination of the right-lower field reveals wheezing. Examination of the left-lower field reveals wheezing. Wheezing present. No rhonchi or rales.  Abdominal:     General: Bowel sounds are normal. There is no distension.     Palpations: Abdomen is soft. There is no mass.     Tenderness: There is no abdominal tenderness. There is no guarding.     Hernia: No hernia is present.  Musculoskeletal:     Cervical back: Normal range of motion and neck supple.     Right lower leg: No edema.     Left lower leg: No edema.  Lymphadenopathy:     Cervical: No cervical adenopathy.  Skin:    General: Skin is warm and dry.  Neurological:     Mental Status: He is alert and oriented to person, place, and time. Mental status is at baseline.  Psychiatric:        Mood and Affect: Mood normal.        Behavior: Behavior normal.      Last depression screening scores    09/20/2023    4:01 PM 08/16/2023    3:15 PM 07/19/2023    3:28 PM  PHQ 2/9 Scores  PHQ - 2 Score 3 2 3   PHQ- 9 Score 12 15 15    Last fall risk screening    09/20/2023    4:45 PM  Fall Risk   Falls in the past year? 0  Number falls in past yr: 0  Injury with Fall? 0  Risk for fall due to : No Fall Risks   Last Audit-C alcohol use screening    09/20/2023    4:45 PM  Alcohol Use Disorder Test (AUDIT)  1. How often do you have a drink containing alcohol? 0  2. How many drinks containing alcohol do you have on a typical day when you are drinking? 0  3. How often do you have six or more drinks on one occasion?  0  AUDIT-C Score 0   A score of 3 or more in women, and 4 or more in men indicates increased risk for alcohol abuse, EXCEPT if all of the points are from question 1   Results for orders placed or performed in visit on 09/20/23  Comprehensive metabolic panel  Result Value Ref Range   Glucose 89 70 - 99 mg/dL   BUN 9 6 - 20 mg/dL   Creatinine, Ser 1.61 0.76 - 1.27 mg/dL   eGFR 88 >09 UE/AVW/0.98   BUN/Creatinine Ratio 8 (L) 9 - 20   Sodium 142 134 - 144 mmol/L   Potassium 4.3 3.5 - 5.2 mmol/L   Chloride 101 96 - 106 mmol/L   CO2 26 20 - 29 mmol/L   Calcium 10.6 (H) 8.7 - 10.2 mg/dL   Total Protein 8.1 6.0 - 8.5 g/dL   Albumin 5.0 4.3 - 5.2 g/dL   Globulin, Total 3.1 1.5 - 4.5 g/dL   Bilirubin Total 0.5 0.0 - 1.2 mg/dL   Alkaline Phosphatase 102 44 - 121 IU/L   AST 46 (H) 0 - 40 IU/L   ALT 40 0 - 44 IU/L  Lipid panel  Result Value Ref Range   Cholesterol, Total 256 (H) 100 - 199 mg/dL   Triglycerides 75 0 - 149 mg/dL   HDL 56 >11 mg/dL   VLDL Cholesterol Cal 12 5 - 40 mg/dL   LDL Chol Calc (NIH) 914 (H) 0 - 99 mg/dL   Chol/HDL Ratio 4.6 0.0 -  5.0 ratio  TSH Rfx on Abnormal to Free T4  Result Value Ref Range   TSH 0.999 0.450 - 4.500 uIU/mL    Assessment & Plan    Routine Health Maintenance and Physical Exam  Exercise Activities and Dietary recommendations  Goals   None     Immunization History  Administered Date(s) Administered   Hepatitis B, ADULT 1991-09-04   Influenza Whole 12/29/2016   Influenza, Seasonal, Injecte, Preservative Fre 09/20/2023   Tdap 12/29/2018    Health Maintenance  Topic Date Due   Hepatitis C Screening  08/15/2024 (Originally 10/30/2010)   HIV Screening  08/15/2024 (Originally 10/31/2007)   DTaP/Tdap/Td (2 - Td or Tdap) 12/29/2028   INFLUENZA VACCINE  Completed   HPV VACCINES  Aged Out   COVID-19 Vaccine  Discontinued    Discussed health benefits of physical activity, and encouraged him to engage in regular exercise appropriate for  his age and condition.   Annual physical exam Assessment & Plan: Physical exam overall unremarkable except as noted above. Routine lab work ordered as noted below.  Orders: -     Comprehensive metabolic panel -     Lipid panel  Anxiety and depression Assessment & Plan: Well-controlled today Continue Bupropion 150mg  bid Patient denies need for refills and will request through pharmacy.   Other tobacco product nicotine dependence in remission Assessment & Plan: Congratulated him on stopping smoking and encouraged him to stick with it.   Elevated blood-pressure reading without diagnosis of hypertension Assessment & Plan: Better controlled today Counseled on diet and exercise Continue to monitor   Apnea Assessment & Plan: Equipment shipped from [facility] 08/23/2023 Patient has done the sleep study and sent it back in. Awaiting result.   Need for influenza vaccination -     Flu vaccine trivalent PF, 6mos and older(Flulaval,Afluria,Fluarix,Fluzone)  Mild intermittent asthma without complication Assessment & Plan: Patient believes he had history of asthma as a child but has not had any medication since then.  Will check pulmonary function test with methacholine challenge.  Prescribed albuterol 90 mcg per actuation 2 puffs every 6 hours as needed for shortness of breath/wheezing.  Encouraged patient to use it on a regular basis for the first few days.  Orders: -     Pulmonary Function Test; Future -     Pulmonary Function Test; Future  Tachycardia Assessment & Plan: Improved Continue to monitor  Orders: -     TSH Rfx on Abnormal to Free T4  Hypercalcemia -     Comprehensive metabolic panel -     Calcium, ionized -     Parathyroid hormone, intact (no Ca) -     VITAMIN D 25 Hydroxy (Vit-D Deficiency, Fractures) -     Vitamin D 1,25 dihydroxy  Depression, recurrent (HCC)   Return in about 6 months (around 03/20/2024) for Anx/Dep.     I discussed the  assessment and treatment plan with the patient  The patient was provided an opportunity to ask questions and all were answered. The patient agreed with the plan and demonstrated an understanding of the instructions.   The patient was advised to call back or seek an in-person evaluation if the symptoms worsen or if the condition fails to improve as anticipated.    Sherlyn Hay, DO  [facility] [phone] (phone) [phone] (fax)  [facility]

## 2023-09-20 NOTE — Assessment & Plan Note (Addendum)
Equipment shipped from [facility] 08/23/2023 Patient has done the sleep study and sent it back in. Awaiting result.

## 2023-09-23 ENCOUNTER — Other Ambulatory Visit: Payer: Self-pay | Admitting: Family Medicine

## 2023-09-23 DIAGNOSIS — J984 Other disorders of lung: Secondary | ICD-10-CM

## 2023-09-23 DIAGNOSIS — J4520 Mild intermittent asthma, uncomplicated: Secondary | ICD-10-CM

## 2023-09-23 LAB — TSH RFX ON ABNORMAL TO FREE T4: TSH: 0.999 u[IU]/mL (ref 0.450–4.500)

## 2023-09-23 LAB — LIPID PANEL
Chol/HDL Ratio: 4.6 {ratio} (ref 0.0–5.0)
Cholesterol, Total: 256 mg/dL — ABNORMAL HIGH (ref 100–199)
HDL: 56 mg/dL (ref >39)
LDL Chol Calc (NIH): 188 mg/dL — ABNORMAL HIGH (ref 0–99)
Triglycerides: 75 mg/dL (ref 0–149)
VLDL Cholesterol Cal: 12 mg/dL (ref 5–40)

## 2023-09-23 LAB — COMPREHENSIVE METABOLIC PANEL
ALT: 40 [IU]/L (ref 0–44)
AST: 46 [IU]/L — ABNORMAL HIGH (ref 0–40)
Albumin: 5 g/dL (ref 4.3–5.2)
Alkaline Phosphatase: 102 [IU]/L (ref 44–121)
BUN/Creatinine Ratio: 8 — ABNORMAL LOW (ref 9–20)
BUN: 9 mg/dL (ref 6–20)
Bilirubin Total: 0.5 mg/dL (ref 0.0–1.2)
CO2: 26 mmol/L (ref 20–29)
Calcium: 10.6 mg/dL — ABNORMAL HIGH (ref 8.7–10.2)
Chloride: 101 mmol/L (ref 96–106)
Creatinine, Ser: 1.15 mg/dL (ref 0.76–1.27)
Globulin, Total: 3.1 g/dL (ref 1.5–4.5)
Glucose: 89 mg/dL (ref 70–99)
Potassium: 4.3 mmol/L (ref 3.5–5.2)
Sodium: 142 mmol/L (ref 134–144)
Total Protein: 8.1 g/dL (ref 6.0–8.5)
eGFR: 88 mL/min/{1.73_m2} (ref >59)

## 2023-09-29 ENCOUNTER — Other Ambulatory Visit: Payer: Self-pay | Admitting: Family Medicine

## 2023-09-29 ENCOUNTER — Encounter: Payer: Self-pay | Admitting: Family Medicine

## 2023-09-29 DIAGNOSIS — J4520 Mild intermittent asthma, uncomplicated: Secondary | ICD-10-CM

## 2023-09-29 MED ORDER — ALBUTEROL SULFATE HFA 108 (90 BASE) MCG/ACT IN AERS
2.0000 | INHALATION_SPRAY | Freq: Four times a day (QID) | RESPIRATORY_TRACT | 3 refills | Status: DC | PRN
Start: 2023-09-29 — End: 2024-07-30

## 2023-09-30 ENCOUNTER — Encounter: Payer: Self-pay | Admitting: Family Medicine

## 2023-09-30 DIAGNOSIS — J4520 Mild intermittent asthma, uncomplicated: Secondary | ICD-10-CM | POA: Insufficient documentation

## 2023-09-30 NOTE — Assessment & Plan Note (Signed)
Congratulated him on stopping smoking and encouraged him to stick with it.

## 2023-09-30 NOTE — Assessment & Plan Note (Signed)
Improved. Continue to monitor. 

## 2023-09-30 NOTE — Assessment & Plan Note (Signed)
Patient believes he had history of asthma as a child but has not had any medication since then.  Will check pulmonary function test with methacholine challenge.  Prescribed albuterol 90 mcg per actuation 2 puffs every 6 hours as needed for shortness of breath/wheezing.  Encouraged patient to use it on a regular basis for the first few days.

## 2023-09-30 NOTE — Assessment & Plan Note (Signed)
Physical exam overall unremarkable except as noted above. Routine lab work ordered as noted below.

## 2023-09-30 NOTE — Assessment & Plan Note (Signed)
Better controlled today Counseled on diet and exercise Continue to monitor

## 2023-10-18 ENCOUNTER — Ambulatory Visit: Payer: 59 | Attending: Cardiology | Admitting: Cardiology

## 2023-10-18 ENCOUNTER — Encounter: Payer: Self-pay | Admitting: Cardiology

## 2023-10-18 VITALS — BP 124/100 | HR 85 | Ht 70.0 in | Wt 230.0 lb

## 2023-10-18 DIAGNOSIS — R072 Precordial pain: Secondary | ICD-10-CM | POA: Diagnosis not present

## 2023-10-18 DIAGNOSIS — K219 Gastro-esophageal reflux disease without esophagitis: Secondary | ICD-10-CM

## 2023-10-18 MED ORDER — METOPROLOL TARTRATE 100 MG PO TABS
100.0000 mg | ORAL_TABLET | Freq: Once | ORAL | 0 refills | Status: DC
Start: 2023-10-18 — End: 2023-12-01

## 2023-10-18 NOTE — Patient Instructions (Signed)
Medication Instructions:   START Over the counter Omeprazole / Prilosec 20 mg daily until your next appt.    *If you need a refill on your cardiac medications before your next appointment, please call your pharmacy*   Lab Work:  Your physician recommends you have labs - BMP   If you have labs (blood work) drawn today and your tests are completely normal, you will receive your results only by: MyChart Message (if you have MyChart) OR A paper copy in the mail If you have any lab test that is abnormal or we need to change your treatment, we will call you to review the results.   Testing/Procedures:  Your physician has requested that you have an echocardiogram. Echocardiography is a painless test that uses sound waves to create images of your heart. It provides your doctor with information about the size and shape of your heart and how well your heart's chambers and valves are working. This procedure takes approximately one hour. There are no restrictions for this procedure. Please do NOT wear cologne, perfume, aftershave, or lotions (deodorant is allowed). Please arrive 15 minutes prior to your appointment time.    Your cardiac CT will be scheduled at one of the below locations:   [facility] [address] [place], [place] [address] [phone]  OR   [facility] Heart and Vascular entrance [address] [place], [place] [address] [phone]  If scheduled at [facility] or [facility], please arrive 15 mins early for check-in and test prep.  There is spacious parking and easy access to the radiology department from the [facility] Heart and Vascular entrance. Please enter here and check-in with the desk attendant.   Please follow these instructions carefully (unless otherwise directed):  An IV will be required for this test and Nitroglycerin will be given.  Hold all erectile dysfunction  medications at least 3 days (72 hrs) prior to test. (Ie viagra, cialis, sildenafil, tadalafil, etc)   On the Night Before the Test: Be sure to Drink plenty of water. Do not consume any caffeinated/decaffeinated beverages or chocolate 12 hours prior to your test. Do not take any antihistamines 12 hours prior to your test.  On the Day of the Test: Drink plenty of water until 1 hour prior to the test. Do not eat any food 1 hour prior to test. You may take your regular medications prior to the test.  Take metoprolol (Lopressor) two hours prior to test.      After the Test: Drink plenty of water. After receiving IV contrast, you may experience a mild flushed feeling. This is normal. On occasion, you may experience a mild rash up to 24 hours after the test. This is not dangerous. If this occurs, you can take Benadryl 25 mg and increase your fluid intake. If you experience trouble breathing, this can be serious. If it is severe call 911 IMMEDIATELY. If it is mild, please call our office. If you take any of these medications: Glipizide/Metformin, Avandament, Glucavance, please do not take 48 hours after completing test unless otherwise instructed.  We will call to schedule your test 2-4 weeks out understanding that some insurance companies will need an authorization prior to the service being performed.   For more information and frequently asked questions, please visit our website : [email or web address]  For non-scheduling related questions, please contact the cardiac imaging nurse navigator should you have any questions/concerns: Cardiac Imaging Nurse Navigators Direct Office  Dial: [phone]   For scheduling needs, including cancellations and rescheduling, please call [place], [phone].   Follow-Up: At [facility], you and your health needs are our priority.  As part of our continuing mission to provide you with exceptional heart care, we have created  designated Provider Care Teams.  These Care Teams include your primary Cardiologist (physician) and Advanced Practice Providers (APPs -  Physician Assistants and Nurse Practitioners) who all work together to provide you with the care you need, when you need it.  We recommend signing up for the patient portal called "MyChart".  Sign up information is provided on this After Visit Summary.  MyChart is used to connect with patients for Virtual Visits (Telemedicine).  Patients are able to view lab/test results, encounter notes, upcoming appointments, etc.  Non-urgent messages can be sent to your provider as well.   To learn more about what you can do with MyChart, go to [email or web address].    Your next appointment:    After Cardiac testing (6-8 weeks)  Provider:   You may see Debbe Odea, MD or one of the following Advanced Practice Providers on your designated Care Team:   Nicolasa Ducking, NP Eula Listen, PA-C Cadence Fransico Michael, PA-C Charlsie Quest, NP

## 2023-10-18 NOTE — Progress Notes (Signed)
Cardiology Office Note:    Date:  10/18/2023   ID:  Justin Small, DOB 1991-06-24, MRN [id number]  PCP:  Sherlyn Hay, DO   [facility] Providers Cardiologist:  Debbe Odea, MD     Referring MD: Racheal Patches, *   Chief Complaint  Patient presents with   New Patient (Initial Visit)    Referred for cardiac evaluation of nonspecific chest pain.  Patient seen at ED on 07/19/23 for intermittent chest pain.  Continues to have intermittent left side chest pain.     Justin Small is a 32 y.o. male who is being seen today for the evaluation of chest pain at the request of Cuthriell, Delorise Royals, *.   History of Present Illness:    Justin Small is a 33 y.o. male with a hx of childhood asthma, former smoker x 12 years who presents with chest pain.  States having chest pain ongoing over the past year.  Symptoms are sometimes associated with exertion, also occur  with drinking energy drinks and coffee which he uses to stay awake for his jobs.  He works 2 jobs, sometimes does not get enough sleep.  Denies any family history of heart disease.  Endorses having acid reflux symptoms.  Recently stopped smoking after 12 years.  Past Medical History:  Diagnosis Date   Asthma    Epididymitis, left 02/27/2019    History reviewed. No pertinent surgical history.  Current Medications: Current Meds  Medication Sig   metoprolol tartrate (LOPRESSOR) 100 MG tablet Take 1 tablet (100 mg total) by mouth once for 1 dose. TWO HOURS PRIOR TO CARDIAC CT     Allergies:   Amoxicillin, Ibuprofen, Trazodone and nefazodone, and Penicillins   Social History   Socioeconomic History   Marital status: Single    Spouse name: Not on file   Number of children: Not on file   Years of education: Not on file   Highest education level: 12th grade  Occupational History   Not on file  Tobacco Use   Smoking status: Former    Current packs/day: 0.00    Types: Cigarettes    Quit date:  10/17/2013    Years since quitting: 10.0    Passive exposure: Current   Smokeless tobacco: Never   Tobacco comments:    Recently quit smoking  Vaping Use   Vaping status: Former  Substance and Sexual Activity   Alcohol use: Not Currently   Drug use: No   Sexual activity: Yes    Birth control/protection: Inserts  Other Topics Concern   Not on file  Social History Narrative   Not on file   Social Determinants of Health   Financial Resource Strain: Low Risk  (08/15/2023)   Overall Financial Resource Strain (CARDIA)    Difficulty of Paying Living Expenses: Not hard at all  Food Insecurity: No Food Insecurity (08/15/2023)   Hunger Vital Sign    Worried About Running Out of Food in the Last Year: Never true    Ran Out of Food in the Last Year: Never true  Transportation Needs: No Transportation Needs (08/15/2023)   PRAPARE - Administrator, Civil Service (Medical): No    Lack of Transportation (Non-Medical): No  Physical Activity: Unknown (08/15/2023)   Exercise Vital Sign    Days of Exercise per Week: 0 days    Minutes of Exercise per Session: Not on file  Stress: No Stress Concern Present (08/15/2023)   [employer] of Occupational Health -  Occupational Stress Questionnaire    Feeling of Stress : Only a little  Social Connections: Moderately Isolated (08/15/2023)   Social Connection and Isolation Panel [NHANES]    Frequency of Communication with Friends and Family: Once a week    Frequency of Social Gatherings with Friends and Family: Once a week    Attends Religious Services: 1 to 4 times per year    Active Member of [employer] or Organizations: No    Attends Engineer, structural: Not on file    Marital Status: Married     Family History: The patient's family history includes Cancer in his paternal grandmother; Heart disease in his maternal grandmother; Hypertension in his father; Sleep apnea in his mother.  ROS:   Please see the history of present  illness.     All other systems reviewed and are negative.  EKGs/Labs/Other Studies Reviewed:    The following studies were reviewed today:  EKG Interpretation Date/Time:  Tuesday October 18 2023 08:34:47 EDT Ventricular Rate:  85 PR Interval:  188 QRS Duration:  72 QT Interval:  348 QTC Calculation: 414 R Axis:   56  Text Interpretation: Normal sinus rhythm Nonspecific T wave abnormality Confirmed by Debbe Odea ([id number]) on 10/18/2023 8:39:00 AM    Recent Labs: 07/19/2023: Hemoglobin 14.5; Platelets 280 09/22/2023: ALT 40; BUN 9; Creatinine, Ser 1.15; Potassium 4.3; Sodium 142; TSH 0.999  Recent Lipid Panel    Component Value Date/Time   CHOL 256 (H) 09/22/2023 1604   TRIG 75 09/22/2023 1604   HDL 56 09/22/2023 1604   CHOLHDL 4.6 09/22/2023 1604   LDLCALC 188 (H) 09/22/2023 1604     Risk Assessment/Calculations:    HYPERTENSION CONTROL Vitals:   10/18/23 0828 10/18/23 0836 10/18/23 0837  BP: (!) 122/100 (!) 124/102 (!) 124/100    The patient's blood pressure is elevated above target today.  In order to address the patient's elevated BP: Blood pressure will be monitored at home to determine if medication changes need to be made.            Physical Exam:    VS:  BP (!) 124/100 (BP Location: Left Arm, Patient Position: Sitting, Cuff Size: Large)   Pulse 85   Ht 5\' 10"  (1.778 m)   Wt 230 lb (104.3 kg)   SpO2 96%   BMI 33.00 kg/m     Wt Readings from Last 3 Encounters:  10/18/23 230 lb (104.3 kg)  09/20/23 220 lb (99.8 kg)  08/16/23 220 lb 11.2 oz (100.1 kg)     GEN:  Well nourished, well developed in no acute distress HEENT: Normal NECK: No JVD; No carotid bruits CARDIAC: RRR, no murmurs, rubs, gallops RESPIRATORY:  Clear to auscultation without rales, wheezing or rhonchi  ABDOMEN: Soft, non-tender, non-distended MUSCULOSKELETAL:  No edema; No deformity  SKIN: Warm and dry NEUROLOGIC:  Alert and oriented x 3 PSYCHIATRIC:  Normal affect    ASSESSMENT:    1. Precordial pain   2. Gastroesophageal reflux disease, unspecified whether esophagitis present    PLAN:    In order of problems listed above:  Chest pain, former smoker.  Obtain echo, obtain coronary CTA to rule out CAD. Endorses symptoms of GERD, start omeprazole OTC 20 mg daily.  Follow-up after cardiac testing.      Medication Adjustments/Labs and Tests Ordered: Current medicines are reviewed at length with the patient today.  Concerns regarding medicines are outlined above.  Orders Placed This Encounter  Procedures   CT CORONARY MORPH  W/CTA COR W/SCORE W/CA W/CM &/OR WO/CM   Basic Metabolic Panel (BMET)   EKG 12-Lead   ECHOCARDIOGRAM COMPLETE   Meds ordered this encounter  Medications   metoprolol tartrate (LOPRESSOR) 100 MG tablet    Sig: Take 1 tablet (100 mg total) by mouth once for 1 dose. TWO HOURS PRIOR TO CARDIAC CT    Dispense:  1 tablet    Refill:  0    Patient Instructions  Medication Instructions:   START Over the counter Omeprazole / Prilosec 20 mg daily until your next appt.    *If you need a refill on your cardiac medications before your next appointment, please call your pharmacy*   Lab Work:  Your physician recommends you have labs - BMP   If you have labs (blood work) drawn today and your tests are completely normal, you will receive your results only by: MyChart Message (if you have MyChart) OR A paper copy in the mail If you have any lab test that is abnormal or we need to change your treatment, we will call you to review the results.   Testing/Procedures:  Your physician has requested that you have an echocardiogram. Echocardiography is a painless test that uses sound waves to create images of your heart. It provides your doctor with information about the size and shape of your heart and how well your heart's chambers and valves are working. This procedure takes approximately one hour. There are no restrictions for this  procedure. Please do NOT wear cologne, perfume, aftershave, or lotions (deodorant is allowed). Please arrive 15 minutes prior to your appointment time.    Your cardiac CT will be scheduled at one of the below locations:   [facility] [address] [place], [place] [address] [phone]  OR   [facility] Heart and Vascular entrance [address] [place], [place] [address] [phone]  If scheduled at [facility] or [facility], please arrive 15 mins early for check-in and test prep.  There is spacious parking and easy access to the radiology department from the [facility] Heart and Vascular entrance. Please enter here and check-in with the desk attendant.   Please follow these instructions carefully (unless otherwise directed):  An IV will be required for this test and Nitroglycerin will be given.  Hold all erectile dysfunction medications at least 3 days (72 hrs) prior to test. (Ie viagra, cialis, sildenafil, tadalafil, etc)   On the Night Before the Test: Be sure to Drink plenty of water. Do not consume any caffeinated/decaffeinated beverages or chocolate 12 hours prior to your test. Do not take any antihistamines 12 hours prior to your test.  On the Day of the Test: Drink plenty of water until 1 hour prior to the test. Do not eat any food 1 hour prior to test. You may take your regular medications prior to the test.  Take metoprolol (Lopressor) two hours prior to test.      After the Test: Drink plenty of water. After receiving IV contrast, you may experience a mild flushed feeling. This is normal. On occasion, you may experience a mild rash up to 24 hours after the test. This is not dangerous. If this occurs, you can take Benadryl 25 mg and increase your fluid intake. If you experience trouble breathing, this can be serious. If it is severe call 911 IMMEDIATELY. If it is mild, please  call our office. If you take any of these medications:  Glipizide/Metformin, Avandament, Glucavance, please do not take 48 hours after completing test unless otherwise instructed.  We will call to schedule your test 2-4 weeks out understanding that some insurance companies will need an authorization prior to the service being performed.   For more information and frequently asked questions, please visit our website : [email or web address]  For non-scheduling related questions, please contact the cardiac imaging nurse navigator should you have any questions/concerns: Cardiac Imaging Nurse Navigators Direct Office Dial: [phone]   For scheduling needs, including cancellations and rescheduling, please call [place], [phone].   Follow-Up: At [facility], you and your health needs are our priority.  As part of our continuing mission to provide you with exceptional heart care, we have created designated Provider Care Teams.  These Care Teams include your primary Cardiologist (physician) and Advanced Practice Providers (APPs -  Physician Assistants and Nurse Practitioners) who all work together to provide you with the care you need, when you need it.  We recommend signing up for the patient portal called "MyChart".  Sign up information is provided on this After Visit Summary.  MyChart is used to connect with patients for Virtual Visits (Telemedicine).  Patients are able to view lab/test results, encounter notes, upcoming appointments, etc.  Non-urgent messages can be sent to your provider as well.   To learn more about what you can do with MyChart, go to [email or web address].    Your next appointment:    After Cardiac testing (6-8 weeks)  Provider:   You may see Debbe Odea, MD or one of the following Advanced Practice Providers on your designated Care Team:   Nicolasa Ducking, NP Eula Listen, PA-C Cadence Fransico Michael, PA-C Charlsie Quest, NP     Signed, Debbe Odea, MD  10/18/2023 9:12 AM    [facility]

## 2023-10-19 LAB — BASIC METABOLIC PANEL WITH GFR
BUN/Creatinine Ratio: 11 (ref 9–20)
BUN: 11 mg/dL (ref 6–20)
CO2: 26 mmol/L (ref 20–29)
Calcium: 10 mg/dL (ref 8.7–10.2)
Chloride: 103 mmol/L (ref 96–106)
Creatinine, Ser: 1 mg/dL (ref 0.76–1.27)
Glucose: 93 mg/dL (ref 70–99)
Potassium: 4.3 mmol/L (ref 3.5–5.2)
Sodium: 142 mmol/L (ref 134–144)
eGFR: 104 mL/min/1.73

## 2023-11-01 ENCOUNTER — Ambulatory Visit: Payer: 59 | Attending: Family Medicine

## 2023-11-01 ENCOUNTER — Encounter (HOSPITAL_COMMUNITY): Payer: Self-pay

## 2023-11-01 DIAGNOSIS — J4520 Mild intermittent asthma, uncomplicated: Secondary | ICD-10-CM | POA: Diagnosis present

## 2023-11-01 LAB — PULMONARY FUNCTION TEST ARMC ONLY
DL/VA % pred: 101 %
DL/VA: 4.94 ml/min/mmHg/L
DLCO unc % pred: 67 %
DLCO unc: 21.9 ml/min/mmHg
FEF 25-75 Post: 4.86 L/s
FEF 25-75 Pre: 3.49 L/s
FEF2575-%Change-Post: 39 %
FEF2575-%Pred-Post: 109 %
FEF2575-%Pred-Pre: 78 %
FEV1-%Change-Post: 8 %
FEV1-%Pred-Post: 70 %
FEV1-%Pred-Pre: 64 %
FEV1-Post: 3.12 L
FEV1-Pre: 2.87 L
FEV1FVC-%Change-Post: 2 %
FEV1FVC-%Pred-Pre: 105 %
FEV6-%Change-Post: 6 %
FEV6-%Pred-Post: 65 %
FEV6-%Pred-Pre: 61 %
FEV6-Post: 3.53 L
FEV6-Pre: 3.31 L
FEV6FVC-%Pred-Post: 101 %
FEV6FVC-%Pred-Pre: 101 %
FVC-%Change-Post: 6 %
FVC-%Pred-Post: 64 %
FVC-%Pred-Pre: 60 %
FVC-Post: 3.53 L
FVC-Pre: 3.31 L
Post FEV1/FVC ratio: 88 %
Post FEV6/FVC ratio: 100 %
Pre FEV1/FVC ratio: 87 %
Pre FEV6/FVC Ratio: 100 %
RV % pred: 108 %
RV: 1.79 L
TLC % pred: 73 %
TLC: 5.07 L

## 2023-11-01 MED ORDER — ALBUTEROL SULFATE (2.5 MG/3ML) 0.083% IN NEBU
2.5000 mg | INHALATION_SOLUTION | Freq: Once | RESPIRATORY_TRACT | Status: AC
  Administered 2023-11-01: 2.5 mg via RESPIRATORY_TRACT
  Filled 2023-11-01: qty 3

## 2023-11-02 ENCOUNTER — Telehealth (HOSPITAL_COMMUNITY): Payer: Self-pay | Admitting: *Deleted

## 2023-11-02 NOTE — Telephone Encounter (Signed)
Attempted to call patient regarding upcoming cardiac CT appointment. Left message on voicemail with name and callback number Hayley Sharpe RN Navigator Cardiac Imaging [facility] [phone] Office   

## 2023-11-03 ENCOUNTER — Ambulatory Visit
Admission: RE | Admit: 2023-11-03 | Discharge: 2023-11-03 | Disposition: A | Discharge status: Home / Self Care | Payer: 59 | Source: Ambulatory Visit | Attending: Cardiology | Admitting: Cardiology

## 2023-11-03 DIAGNOSIS — R072 Precordial pain: Secondary | ICD-10-CM | POA: Diagnosis present

## 2023-11-03 MED ORDER — NITROGLYCERIN 0.4 MG SL SUBL
0.8000 mg | SUBLINGUAL_TABLET | Freq: Once | SUBLINGUAL | Status: AC
  Administered 2023-11-03: 0.8 mg via SUBLINGUAL

## 2023-11-03 MED ORDER — IOHEXOL 350 MG/ML SOLN
100.0000 mL | Freq: Once | INTRAVENOUS | Status: AC | PRN
  Administered 2023-11-03: 100 mL via INTRAVENOUS

## 2023-11-03 MED ORDER — SODIUM CHLORIDE 0.9 % IV BOLUS
125.0000 mL | Freq: Once | INTRAVENOUS | Status: AC
  Administered 2023-11-03: 125 mL via INTRAVENOUS

## 2023-11-03 MED ORDER — METOPROLOL TARTRATE 5 MG/5ML IV SOLN
10.0000 mg | Freq: Once | INTRAVENOUS | Status: AC | PRN
  Administered 2023-11-03: 10 mg via INTRAVENOUS

## 2023-11-03 MED ORDER — DILTIAZEM HCL 25 MG/5ML IV SOLN
10.0000 mg | INTRAVENOUS | Status: DC | PRN
  Administered 2023-11-03: 5 mg via INTRAVENOUS

## 2023-11-03 NOTE — Progress Notes (Signed)
Pt tolerated procedure well with no issues. Pt ABCs intact. Pt denies any complaints. Pt encouraged to drink plenty of water throughout the day. Pt ambulatory with steady gait.

## 2023-11-08 ENCOUNTER — Ambulatory Visit: Payer: 59 | Attending: Cardiology

## 2023-11-08 DIAGNOSIS — R072 Precordial pain: Secondary | ICD-10-CM | POA: Diagnosis not present

## 2023-11-08 LAB — ECHOCARDIOGRAM COMPLETE
Area-P 1/2: 4.06 cm2
S' Lateral: 2.7 cm

## 2023-12-01 ENCOUNTER — Ambulatory Visit: Payer: 59 | Attending: Cardiology | Admitting: Cardiology

## 2023-12-01 ENCOUNTER — Encounter: Payer: Self-pay | Admitting: Cardiology

## 2023-12-01 VITALS — BP 148/91 | HR 98 | Ht 70.0 in | Wt 227.6 lb

## 2023-12-01 DIAGNOSIS — R072 Precordial pain: Secondary | ICD-10-CM

## 2023-12-01 DIAGNOSIS — K219 Gastro-esophageal reflux disease without esophagitis: Secondary | ICD-10-CM | POA: Diagnosis not present

## 2023-12-01 NOTE — Patient Instructions (Signed)
Medication Instructions:   Your physician recommends that you continue on your current medications as directed. Please refer to the Current Medication list given to you today.  *If you need a refill on your cardiac medications before your next appointment, please call your pharmacy*   Lab Work:  None Ordered  If you have labs (blood work) drawn today and your tests are completely normal, you will receive your results only by: MyChart Message (if you have MyChart) OR A paper copy in the mail If you have any lab test that is abnormal or we need to change your treatment, we will call you to review the results.   Testing/Procedures:  None Ordered  Follow-Up: At [facility], you and your health needs are our priority.  As part of our continuing mission to provide you with exceptional heart care, we have created designated Provider Care Teams.  These Care Teams include your primary Cardiologist (physician) and Advanced Practice Providers (APPs -  Physician Assistants and Nurse Practitioners) who all work together to provide you with the care you need, when you need it.  We recommend signing up for the patient portal called "MyChart".  Sign up information is provided on this After Visit Summary.  MyChart is used to connect with patients for Virtual Visits (Telemedicine).  Patients are able to view lab/test results, encounter notes, upcoming appointments, etc.  Non-urgent messages can be sent to your provider as well.   To learn more about what you can do with MyChart, go to [email or web address].    Your next appointment:    As Needed

## 2023-12-01 NOTE — Progress Notes (Signed)
Cardiology Office Note:    Date:  12/01/2023   ID:  Justin Small, DOB Apr 20, 1991, MRN [id number]  PCP:  Sherlyn Hay, DO   [facility] Providers Cardiologist:  Debbe Odea, MD     Referring MD: Sherlyn Hay, DO   Chief Complaint  Patient presents with   Follow-up    Discuss cardiac testing results.  Patient denies new or acute cardiac problems/concerns today.      History of Present Illness:    Justin Small is a 32 y.o. male with a hx of childhood asthma, former smoker x 12 years who presents for follow-up.  Previously seen due to chest pain.  Echo and coronary CTA obtained to evaluate cardiac etiology.  Did endorse having reflux symptoms, omeprazole 20 mg daily was recommended.  He states symptoms resolved with taking omeprazole 20 mg daily x 2 weeks.  Has no concerns at this time.  Presents for cardiac testing results.   Past Medical History:  Diagnosis Date   Asthma    Epididymitis, left 02/27/2019    History reviewed. No pertinent surgical history.  Current Medications: No outpatient medications have been marked as taking for the 12/01/23 encounter (Office Visit) with Debbe Odea, MD.     Allergies:   Amoxicillin, Ibuprofen, Trazodone and nefazodone, and Penicillins   Social History   Socioeconomic History   Marital status: Single    Spouse name: Not on file   Number of children: Not on file   Years of education: Not on file   Highest education level: 12th grade  Occupational History   Not on file  Tobacco Use   Smoking status: Former    Current packs/day: 0.00    Types: Cigarettes    Quit date: 10/17/2013    Years since quitting: 10.1    Passive exposure: Current   Smokeless tobacco: Never   Tobacco comments:    Recently quit smoking  Vaping Use   Vaping status: Former  Substance and Sexual Activity   Alcohol use: Not Currently   Drug use: No   Sexual activity: Yes    Birth control/protection: Inserts  Other  Topics Concern   Not on file  Social History Narrative   Not on file   Social Drivers of Health   Financial Resource Strain: Low Risk  (08/15/2023)   Overall Financial Resource Strain (CARDIA)    Difficulty of Paying Living Expenses: Not hard at all  Food Insecurity: No Food Insecurity (08/15/2023)   Hunger Vital Sign    Worried About Running Out of Food in the Last Year: Never true    Ran Out of Food in the Last Year: Never true  Transportation Needs: No Transportation Needs (08/15/2023)   PRAPARE - Administrator, Civil Service (Medical): No    Lack of Transportation (Non-Medical): No  Physical Activity: Unknown (08/15/2023)   Exercise Vital Sign    Days of Exercise per Week: 0 days    Minutes of Exercise per Session: Not on file  Stress: No Stress Concern Present (08/15/2023)   [employer] of Occupational Health - Occupational Stress Questionnaire    Feeling of Stress : Only a little  Social Connections: Moderately Isolated (08/15/2023)   Social Connection and Isolation Panel [NHANES]    Frequency of Communication with Friends and Family: Once a week    Frequency of Social Gatherings with Friends and Family: Once a week    Attends Religious Services: 1 to 4 times per year  Active Member of Clubs or Organizations: No    Attends Engineer, structural: Not on file    Marital Status: Married     Family History: The patient's family history includes Cancer in his paternal grandmother; Heart disease in his maternal grandmother; Hypertension in his father; Sleep apnea in his mother.  ROS:   Please see the history of present illness.     All other systems reviewed and are negative.  EKGs/Labs/Other Studies Reviewed:    The following studies were reviewed today:       Recent Labs: 07/19/2023: Hemoglobin 14.5; Platelets 280 09/22/2023: ALT 40; TSH 0.999 10/18/2023: BUN 11; Creatinine, Ser 1.00; Potassium 4.3; Sodium 142  Recent Lipid Panel     Component Value Date/Time   CHOL 256 (H) 09/22/2023 1604   TRIG 75 09/22/2023 1604   HDL 56 09/22/2023 1604   CHOLHDL 4.6 09/22/2023 1604   LDLCALC 188 (H) 09/22/2023 1604     Risk Assessment/Calculations:    HYPERTENSION CONTROL Vitals:   12/01/23 1016 12/01/23 1023  BP: (!) 144/88 (!) 148/91    The patient's blood pressure is elevated above target today.  In order to address the patient's elevated BP:             Physical Exam:    VS:  BP (!) 148/91 (BP Location: Left Arm, Patient Position: Sitting, Cuff Size: Large)   Pulse 98   Ht 5\' 10"  (1.778 m)   Wt 227 lb 9.6 oz (103.2 kg)   SpO2 97%   BMI 32.66 kg/m     Wt Readings from Last 3 Encounters:  12/01/23 227 lb 9.6 oz (103.2 kg)  10/18/23 230 lb (104.3 kg)  09/20/23 220 lb (99.8 kg)     GEN:  Well nourished, well developed in no acute distress HEENT: Normal NECK: No JVD; No carotid bruits CARDIAC: RRR, no murmurs, rubs, gallops RESPIRATORY:  Clear to auscultation without rales, wheezing or rhonchi  ABDOMEN: Soft, non-tender, non-distended MUSCULOSKELETAL:  No edema; No deformity  SKIN: Warm and dry NEUROLOGIC:  Alert and oriented x 3 PSYCHIATRIC:  Normal affect   ASSESSMENT:    1. Precordial pain   2. Gastroesophageal reflux disease, unspecified whether esophagitis present     PLAN:    In order of problems listed above:  Chest pain, former smoker.  Coronary CT 11/24 no CAD, calcium score 0.  Echo 11/24 normal EF 60 to 65%.  Symptoms resolved after starting omeprazole indicating GI etiology. GERD, continue OTC omeprazole as needed.  Follow-up with PCP if symptoms were to return.  Consider standing PPI .  BP elevated today, usually controlled.  Monitor off BP medications.  Follow-up as needed.      Medication Adjustments/Labs and Tests Ordered: Current medicines are reviewed at length with the patient today.  Concerns regarding medicines are outlined above.  No orders of the defined types were  placed in this encounter.  No orders of the defined types were placed in this encounter.   Patient Instructions  Medication Instructions:   Your physician recommends that you continue on your current medications as directed. Please refer to the Current Medication list given to you today.  *If you need a refill on your cardiac medications before your next appointment, please call your pharmacy*   Lab Work:  None Ordered  If you have labs (blood work) drawn today and your tests are completely normal, you will receive your results only by: MyChart Message (if you have MyChart) OR A  paper copy in the mail If you have any lab test that is abnormal or we need to change your treatment, we will call you to review the results.   Testing/Procedures:  None Ordered   Follow-Up: At [facility], you and your health needs are our priority.  As part of our continuing mission to provide you with exceptional heart care, we have created designated Provider Care Teams.  These Care Teams include your primary Cardiologist (physician) and Advanced Practice Providers (APPs -  Physician Assistants and Nurse Practitioners) who all work together to provide you with the care you need, when you need it.  We recommend signing up for the patient portal called "MyChart".  Sign up information is provided on this After Visit Summary.  MyChart is used to connect with patients for Virtual Visits (Telemedicine).  Patients are able to view lab/test results, encounter notes, upcoming appointments, etc.  Non-urgent messages can be sent to your provider as well.   To learn more about what you can do with MyChart, go to [email or web address].    Your next appointment:    As Needed   Signed, Debbe Odea, MD  12/01/2023 10:36 AM    [facility]

## 2023-12-26 ENCOUNTER — Telehealth: Payer: Self-pay | Admitting: Family Medicine

## 2023-12-26 NOTE — Telephone Encounter (Signed)
 Pt wife Lauraine Rinne [phone] is calling to follow up on the referral to pulmonology. Referral states pending. Please advise

## 2024-01-04 ENCOUNTER — Encounter: Payer: Self-pay | Admitting: Student in an Organized Health Care Education/Training Program

## 2024-01-04 ENCOUNTER — Ambulatory Visit: Payer: 59 | Admitting: Student in an Organized Health Care Education/Training Program

## 2024-01-04 VITALS — BP 138/90 | HR 82 | Temp 97.8°F | Ht 70.0 in | Wt 230.2 lb

## 2024-01-04 DIAGNOSIS — J984 Other disorders of lung: Secondary | ICD-10-CM

## 2024-01-04 DIAGNOSIS — J4520 Mild intermittent asthma, uncomplicated: Secondary | ICD-10-CM

## 2024-01-04 DIAGNOSIS — F17291 Nicotine dependence, other tobacco product, in remission: Secondary | ICD-10-CM | POA: Diagnosis not present

## 2024-01-04 LAB — NITRIC OXIDE: Nitric Oxide: 11

## 2024-01-04 MED ORDER — BUDESONIDE-FORMOTEROL FUMARATE 160-4.5 MCG/ACT IN AERO: 2.0000 | INHALATION_SPRAY | Freq: Two times a day (BID) | RESPIRATORY_TRACT | 12 refills | Status: DC

## 2024-01-04 NOTE — Progress Notes (Signed)
 Assessment & Plan:   1. Mild intermittent asthma without complication 2. Restrictive lung disease 3. Other tobacco product nicotine dependence in remission  Patient is a pleasant 33 year old male with past medical history of childhood onset asthma presenting for the evaluation of abnormal pulmonary function testing in addition to occasional chest tightness and shortness of breath.  I have personally reviewed his pulmonary function test and note an obstructive defect with some bronchodilator response evidenced by improvement in FEV 1 by 260 mL.  There is also a restrictive pattern with a drop in TLC as well as a drop in DLCO.  Interestingly, Kc is within normal suggesting that the drop in DLCO is secondary to abnormally low lung volumes.  The differential for this includes interstitial lung disease, pulmonary hypertension, and neuromuscular weakness.  There is the possibility that he has superimposed/underlying asthma.  Bronchopulmonary dysplasia is also on the differential.  Echocardiography showed normal RV function and was not suggestive of pulmonary hypertension.  I will expand the workup by obtaining high-resolution chest CT to assess for any signs of positional lung disease and/or sarcoidosis.  FeNO in clinic was normal today at 11 ppb but given his history as well as recurrent use of albuterol , I will initiate him on controller inhaler with ICS/LABA (Symbicort ).  I will also repeat his pulmonary function test to include MIP/MEP to assess for signs of neuromuscular weakness  - budesonide -formoterol  (SYMBICORT ) 160-4.5 MCG/ACT inhaler; Inhale 2 puffs into the lungs in the morning and at bedtime.  Dispense: 1 each; Refill: 12 - CT CHEST HIGH RESOLUTION; Future - Pulmonary Function Test [facility] Only; Future - Nitric oxide    Return in about 2 months (around 03/03/2024).  I spent 60 minutes caring for this patient today, including preparing to see the patient, obtaining a medical history ,  reviewing a separately obtained history, performing a medically appropriate examination and/or evaluation, counseling and educating the patient/family/caregiver, ordering medications, tests, or procedures, documenting clinical information in the electronic health record, and independently interpreting results (not separately reported/billed) and communicating results to the patient/family/caregiver  Vergia Glasgow, MD [facility] 01/04/2024 12:36 PM    End of visit medications:  Meds ordered this encounter  Medications   budesonide -formoterol  (SYMBICORT ) 160-4.5 MCG/ACT inhaler    Sig: Inhale 2 puffs into the lungs in the morning and at bedtime.    Dispense:  1 each    Refill:  12     Current Outpatient Medications:    albuterol  (VENTOLIN  HFA) 108 (90 Base) MCG/ACT inhaler, Inhale 2 puffs into the lungs every 6 (six) hours as needed for wheezing or shortness of breath., Disp: 18 g, Rfl: 3   budesonide -formoterol  (SYMBICORT ) 160-4.5 MCG/ACT inhaler, Inhale 2 puffs into the lungs in the morning and at bedtime., Disp: 1 each, Rfl: 12   Subjective:   PATIENT ID: Justin Small GENDER: male DOB: Mar 16, 1991, MRN: [id number]  Chief Complaint  Patient presents with   Consult    Shortness of breath on exertion and rest. Cough. No wheezing.     HPI  Justin Small is a pleasant 33 year old male with a past medical history of childhood onset asthma presenting to clinic for the evaluation of shortness of breath.  He is referred to us  from his primary care physician's office for workup of an abnormal pulmonary function test. He was initially seen by his primary care physician for chest discomfort for which he was referred to cardiology.  Workup has included echocardiography which was normal  and a coronary CT also noted to be normal and without abnormalities.  He underwent a pulmonary function test (spirometry with bronchodilator challenge, lung volumes, and DLCO) which was noted  to have a mild restrictive and obstructive pattern.  Patient reports a history of childhood onset asthma where he recalls requiring inhalers/nebulizers as a child.  He reports a few asthma exacerbations growing up.  He felt that he grew out of his asthma as a young adult and this was not an issue for him over the past few years.  He was prescribed albuterol  by his primary care physician which he is currently using regularly.  Some days he uses it once or twice and other days he uses it 4 or 5 times.  He reports occasional wheezing but denies any constant wheeze or cough productive of sputum.  He does have the occasional chest tightness that is not relieved with the use of albuterol .  He does not have any fevers, chills, night sweats, or weight loss.  He does not have any cough or sputum production.  Review of systems notable for right index finger swelling that occurred a couple years ago and has persisted.  Denies any erythema or pain.  No other joint pains or joint swelling noted.  No swelling in the lower extremities, no skin rashes or skin ulcers, no mouth ulcers, and no eye inflammation reported.  He denies any history of recurrent infections, denies any muscle weakness, denies muscle twitching/fasciculations.  Patient reports a history of smoking, quit 1 year ago.  He started smoking around the age of 54, smoked 1 pack a day until the age of 49 at which point he switched to using nicotine vapes.  He quit vape use was in September 2024.  Overall, he likely has 10 pack years of smoking history.  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  He reports seasonal allergies.  He also works as a Nature conservation officer at [employer] where he feels symptoms exacerbate secondary to dust.  No family history of asthma is reported.  He reports some secondhand smoke exposure growing up.  He is not sure if he was born at term or preterm.  Ancillary information including prior medications, full  medical/surgical/family/social histories, and PFTs (when available) are listed below and have been reviewed.   Review of Systems  Constitutional:  Negative for chills, diaphoresis, fever, malaise/fatigue and weight loss.  HENT:  Negative for sore throat.   Eyes:  Negative for blurred vision, double vision, pain, discharge and redness.  Respiratory:  Positive for shortness of breath. Negative for cough, hemoptysis, sputum production, wheezing and stridor.   Cardiovascular:  Negative for chest pain, palpitations and leg swelling.  Gastrointestinal:  Negative for blood in stool, constipation, diarrhea, heartburn and melena.  Genitourinary:  Negative for dysuria and hematuria.  Musculoskeletal:  Negative for joint pain and myalgias.  Neurological:  Negative for dizziness, focal weakness, loss of consciousness and weakness.  Endo/Heme/Allergies:  Does not bruise/bleed easily.     Objective:   Vitals:   01/04/24 1051  BP: (!) 138/90  Pulse: 82  Temp: 97.8 F (36.6 C)  TempSrc: Temporal  SpO2: 99%  Weight: 230 lb 3.2 oz (104.4 kg)  Height: 5\' 10"  (1.778 m)   99% on RA BMI Readings from Last 3 Encounters:  01/04/24 33.03 kg/m  12/01/23 32.66 kg/m  10/18/23 33.00 kg/m   Wt Readings from Last 3 Encounters:  01/04/24 230 lb 3.2 oz (  104.4 kg)  12/01/23 227 lb 9.6 oz (103.2 kg)  10/18/23 230 lb (104.3 kg)    Physical Exam Constitutional:      Appearance: Normal appearance.  HENT:     Head: Normocephalic and atraumatic.     Mouth/Throat:     Mouth: Mucous membranes are moist.  Eyes:     Extraocular Movements: Extraocular movements intact.     Pupils: Pupils are equal, round, and reactive to light.  Cardiovascular:     Rate and Rhythm: Normal rate and regular rhythm.     Pulses: Normal pulses.     Heart sounds: Normal heart sounds.  Pulmonary:     Effort: Pulmonary effort is normal. No respiratory distress.     Breath sounds: Normal breath sounds. No stridor. No wheezing,  rhonchi or rales.  Abdominal:     Palpations: Abdomen is soft.  Musculoskeletal:     Right lower leg: No edema.     Left lower leg: No edema.  Neurological:     General: No focal deficit present.     Mental Status: He is alert and oriented to person, place, and time. Mental status is at baseline.     Ancillary Information    Past Medical History:  Diagnosis Date   Asthma    Epididymitis, left 02/27/2019     Family History  Problem Relation Age of Onset   Sleep apnea Mother    Hypertension Father    Heart disease Maternal Grandmother    Cancer Paternal Grandmother      No past surgical history on file.  Social History   Socioeconomic History   Marital status: Single    Spouse name: Not on file   Number of children: Not on file   Years of education: Not on file   Highest education level: 12th grade  Occupational History   Not on file  Tobacco Use   Smoking status: Former    Current packs/day: 0.00    Types: Cigarettes    Quit date: 10/17/2013    Years since quitting: 10.2    Passive exposure: Current   Smokeless tobacco: Never   Tobacco comments:    Recently quit smoking  Vaping Use   Vaping status: Former  Substance and Sexual Activity   Alcohol use: Not Currently   Drug use: No   Sexual activity: Yes    Birth control/protection: Inserts  Other Topics Concern   Not on file  Social History Narrative   Not on file   Social Drivers of Health   Financial Resource Strain: Low Risk  (08/15/2023)   Overall Financial Resource Strain (CARDIA)    Difficulty of Paying Living Expenses: Not hard at all  Food Insecurity: No Food Insecurity (08/15/2023)   Hunger Vital Sign    Worried About Running Out of Food in the Last Year: Never true    Ran Out of Food in the Last Year: Never true  Transportation Needs: No Transportation Needs (08/15/2023)   PRAPARE - Administrator, Civil Service (Medical): No    Lack of Transportation (Non-Medical): No   Physical Activity: Unknown (08/15/2023)   Exercise Vital Sign    Days of Exercise per Week: 0 days    Minutes of Exercise per Session: Not on file  Stress: No Stress Concern Present (08/15/2023)   [employer] of Occupational Health - Occupational Stress Questionnaire    Feeling of Stress : Only a little  Social Connections: Moderately Isolated (08/15/2023)   Social Connection  and Isolation Panel [NHANES]    Frequency of Communication with Friends and Family: Once a week    Frequency of Social Gatherings with Friends and Family: Once a week    Attends Religious Services: 1 to 4 times per year    Active Member of [employer] or Organizations: No    Attends Engineer, structural: Not on file    Marital Status: Married  Intimate Partner Violence: Not on file     Allergies  Allergen Reactions   Amoxicillin Swelling   Ibuprofen  Swelling   Trazodone And Nefazodone Swelling    Throat swelling   Penicillins Swelling     CBC    Component Value Date/Time   WBC 7.1 07/19/2023 1647   RBC 5.26 07/19/2023 1647   HGB 14.5 07/19/2023 1647   HCT 44.1 07/19/2023 1647   PLT 280 07/19/2023 1647   MCV 83.8 07/19/2023 1647   MCH 27.6 07/19/2023 1647   MCHC 32.9 07/19/2023 1647   RDW 14.1 07/19/2023 1647    Pulmonary Functions Testing Results:    Latest Ref Rng & Units 11/01/2023    4:37 PM  PFT Results  FVC-Pre L 3.31   FVC-Predicted Pre % 60   FVC-Post L 3.53   FVC-Predicted Post % 64   Pre FEV1/FVC % % 87   Post FEV1/FCV % % 88   FEV1-Pre L 2.87   FEV1-Predicted Pre % 64   FEV1-Post L 3.12   DLCO uncorrected ml/min/mmHg 21.90   DLCO UNC% % 67   DLVA Predicted % 101   TLC L 5.07   TLC % Predicted % 73   RV % Predicted % 108     Outpatient Medications Prior to Visit  Medication Sig Dispense Refill   albuterol  (VENTOLIN  HFA) 108 (90 Base) MCG/ACT inhaler Inhale 2 puffs into the lungs every 6 (six) hours as needed for wheezing or shortness of breath. 18 g 3   buPROPion   (WELLBUTRIN  SR) 150 MG 12 hr tablet Take one 150 mg orally once daily in the morning for 3 days, then increase to 150 mg orally twice daily, with at least 8 hours between doses; No more than two tablets per day. (Patient not taking: Reported on 12/01/2023) 57 tablet 0   No facility-administered medications prior to visit.

## 2024-01-05 ENCOUNTER — Telehealth: Payer: Self-pay | Admitting: Student in an Organized Health Care Education/Training Program

## 2024-01-05 DIAGNOSIS — J4520 Mild intermittent asthma, uncomplicated: Secondary | ICD-10-CM

## 2024-01-05 NOTE — Telephone Encounter (Signed)
Patient went to pick up Symbicort but the price was too high. Patient is wondering if there is a generic brand or alternative he can take.   Pharmacy: [facility] in [place]

## 2024-01-05 NOTE — Telephone Encounter (Signed)
Pharmacy team please see what a cheaper alternative would be.Thank you!

## 2024-01-09 ENCOUNTER — Other Ambulatory Visit (HOSPITAL_COMMUNITY): Payer: Self-pay

## 2024-01-09 NOTE — Telephone Encounter (Signed)
Test claim for alternatives are as follows:   ICS+LABA Breyna - non-formulary Symbicort - (brand) $105.25  (generic) non-formulary Dulera - non-formulary Breo - $133.03 Advair HFA - (brand) $142.56  (generic) non-formulary Advair Diskus - (brand) non-formulary  (generic) $81.81 Wixela Inhub - $81.81  Looks like the patient might have a deductible to meet as well.

## 2024-01-10 MED ORDER — FLUTICASONE-SALMETEROL 250-50 MCG/ACT IN AEPB: 1.0000 | INHALATION_SPRAY | Freq: Two times a day (BID) | RESPIRATORY_TRACT | 11 refills | Status: DC

## 2024-01-10 NOTE — Telephone Encounter (Signed)
I notified the patient. He would like one of the other inhalers sent in.

## 2024-01-11 NOTE — Telephone Encounter (Signed)
 Patient advised as below. Nothing further needed.

## 2024-01-18 ENCOUNTER — Ambulatory Visit: Payer: Self-pay | Admitting: *Deleted

## 2024-01-18 NOTE — Telephone Encounter (Signed)
  Chief Complaint: swelling left side of neck, sore throat Symptoms: sore throat, swelling in neck- whole left side of neck, tender to touch Frequency: started Monday  Pertinent Negatives: Patient denies fever, ear pain Disposition: [] ED /[x] Urgent Care (no appt availability in office) / [] Appointment(In office/virtual)/ []  [facility]/ [] Home Care/ [] Refused Recommended Disposition /[] [facility]/ []  Follow-up with PCP Additional Notes: Patient offered appointment tomorrow- am/pm- work schedule does not allow- will go to [facility]

## 2024-01-18 NOTE — Telephone Encounter (Signed)
Reason for Disposition  [1] Tender node in the neck AND [2] also has a sore throat AND [3] minimal/no runny nose or cough  Answer Assessment - Initial Assessment Questions 1. LOCATION: "Where is the swollen node located?" "Is the matching node on the other side of the body also swollen?"      Left side of neck- below the ear to shoulder- collar bone 2. SIZE: "How big is the node?" (e.g., inches or centimeters; or compared to common objects such as pea, bean, marble, golf ball)      Puffy- noticeable 3. ONSET: "When did the swelling start?"      Started Monday 4. NECK NODES: "Is there a sore throat, runny nose or other symptoms of a cold?"      Sore throat, cough 5. GROIN OR ARMPIT NODES: "Is there a sore, scratch, cut or painful red area on that arm or leg?"      no 6. FEVER: "Do you have a fever?" If Yes, ask: "What is it, how was it measured, and when did it start?"      no 7. CAUSE: "What do you think is causing the swollen lymph nodes?"     unsure 8. OTHER SYMPTOMS: "Do you have any other symptoms?"     Sore throat  Protocols used: Lymph Nodes - Swollen-A-AH

## 2024-03-20 ENCOUNTER — Ambulatory Visit: Payer: 59 | Admitting: Family Medicine

## 2024-04-19 ENCOUNTER — Ambulatory Visit: Attending: Student in an Organized Health Care Education/Training Program

## 2024-04-19 ENCOUNTER — Ambulatory Visit
Admission: RE | Admit: 2024-04-19 | Discharge: 2024-04-19 | Disposition: A | Discharge status: Home / Self Care | Source: Ambulatory Visit | Attending: Student in an Organized Health Care Education/Training Program | Admitting: Student in an Organized Health Care Education/Training Program

## 2024-04-19 DIAGNOSIS — J984 Other disorders of lung: Secondary | ICD-10-CM | POA: Diagnosis present

## 2024-04-19 DIAGNOSIS — J4520 Mild intermittent asthma, uncomplicated: Secondary | ICD-10-CM | POA: Diagnosis present

## 2024-04-25 ENCOUNTER — Other Ambulatory Visit: Payer: Self-pay

## 2024-04-25 DIAGNOSIS — J4520 Mild intermittent asthma, uncomplicated: Secondary | ICD-10-CM

## 2024-04-26 ENCOUNTER — Ambulatory Visit: Payer: 59 | Admitting: Student in an Organized Health Care Education/Training Program

## 2024-05-01 ENCOUNTER — Ambulatory Visit: Admitting: Student in an Organized Health Care Education/Training Program

## 2024-05-01 DIAGNOSIS — J4520 Mild intermittent asthma, uncomplicated: Secondary | ICD-10-CM | POA: Diagnosis not present

## 2024-05-01 LAB — PULMONARY FUNCTION TEST
DL/VA % pred: 115 %
DL/VA: 5.73 ml/min/mmHg/L
DLCO unc % pred: 71 %
DLCO unc: 23.62 ml/min/mmHg
FEF 25-75 Post: 3.18 L/s
FEF 25-75 Pre: 3.66 L/s
FEF2575-%Change-Post: -13 %
FEF2575-%Pred-Post: 71 %
FEF2575-%Pred-Pre: 82 %
FEV1-%Change-Post: -3 %
FEV1-%Pred-Post: 67 %
FEV1-%Pred-Pre: 70 %
FEV1-Post: 3 L
FEV1-Pre: 3.11 L
FEV1FVC-%Change-Post: 3 %
FEV1FVC-%Pred-Pre: 103 %
FEV6-%Change-Post: -6 %
FEV6-%Pred-Post: 63 %
FEV6-%Pred-Pre: 68 %
FEV6-Post: 3.42 L
FEV6-Pre: 3.68 L
FEV6FVC-%Pred-Post: 101 %
FEV6FVC-%Pred-Pre: 101 %
FVC-%Change-Post: -6 %
FVC-%Pred-Post: 62 %
FVC-%Pred-Pre: 67 %
FVC-Post: 3.42 L
FVC-Pre: 3.68 L
Post FEV1/FVC ratio: 88 %
Post FEV6/FVC ratio: 100 %
Pre FEV1/FVC ratio: 85 %
Pre FEV6/FVC Ratio: 100 %
RV % pred: 79 %
RV: 1.31 L
TLC % pred: 64 %
TLC: 4.48 L

## 2024-05-01 NOTE — Progress Notes (Signed)
 Full PFT completed today ? ?

## 2024-05-01 NOTE — Patient Instructions (Signed)
 Full PFT completed today ? ?

## 2024-05-02 ENCOUNTER — Ambulatory Visit (INDEPENDENT_AMBULATORY_CARE_PROVIDER_SITE_OTHER): Admitting: Student in an Organized Health Care Education/Training Program

## 2024-05-02 ENCOUNTER — Encounter: Payer: Self-pay | Admitting: Student in an Organized Health Care Education/Training Program

## 2024-05-02 VITALS — BP 102/90 | HR 108 | Temp 98.8°F | Ht 70.0 in | Wt 233.0 lb

## 2024-05-02 DIAGNOSIS — F17291 Nicotine dependence, other tobacco product, in remission: Secondary | ICD-10-CM

## 2024-05-02 DIAGNOSIS — J4520 Mild intermittent asthma, uncomplicated: Secondary | ICD-10-CM | POA: Diagnosis not present

## 2024-05-02 DIAGNOSIS — J984 Other disorders of lung: Secondary | ICD-10-CM

## 2024-05-02 MED ORDER — FLUTICASONE-SALMETEROL 100-50 MCG/ACT IN AEPB: 1.0000 | INHALATION_SPRAY | Freq: Two times a day (BID) | RESPIRATORY_TRACT | 11 refills | Status: DC

## 2024-05-03 NOTE — Progress Notes (Signed)
 Assessment & Plan:   1. Mild intermittent asthma without complication (Primary) 2. Restrictive lung disease 3. Other tobacco product nicotine dependence in remission  Patient is a pleasant 33 year old male with past medical history of childhood onset asthma presenting for the evaluation of abnormal pulmonary function testing in addition to occasional chest tightness and shortness of breath.   His initial PFT's showed a mixed obstructive and restrictive defect, with some bronchodilator response. We prescribed Symbicort  but he was unable to get it. Today, I will try to prescribe him Wixela in hopes that it is better covered by his plan. His PFT's had previously also shown a drop in TLC and DLCO suggestive of restriction. This is confirmed on the repeat PFT that he underwent recently.  The differential for this includes interstitial lung disease (HRCT does not show ILD), pulmonary hypertension (TTE normal), and neuromuscular weakness (no diaphragmatic weakness, will consider MIP/MEP evaluation on follow up).  There is the possibility that he has superimposed/underlying asthma with remodeling and resultant restriction. He could have also developed lung damage secondary to smoking as well.     - Will perform MIP/MEP - fluticasone -salmeterol (WIXELA INHUB) 100-50 MCG/ACT AEPB; Inhale 1 puff into the lungs 2 (two) times daily.  Dispense: 60 each; Refill: 11   Return in about 3 months (around 08/02/2024).  I spent 30 minutes caring for this patient today, including preparing to see the patient, obtaining a medical history , reviewing a separately obtained history, performing a medically appropriate examination and/or evaluation, counseling and educating the patient/family/caregiver, ordering medications, tests, or procedures, documenting clinical information in the electronic health record, and independently interpreting results (not separately reported/billed) and communicating results to the  patient/family/caregiver  Justin Glasgow, MD [facility]   End of visit medications:  Meds ordered this encounter  Medications   fluticasone -salmeterol (WIXELA INHUB) 100-50 MCG/ACT AEPB    Sig: Inhale 1 puff into the lungs 2 (two) times daily.    Dispense:  60 each    Refill:  11     Current Outpatient Medications:    albuterol  (VENTOLIN  HFA) 108 (90 Base) MCG/ACT inhaler, Inhale 2 puffs into the lungs every 6 (six) hours as needed for wheezing or shortness of breath., Disp: 18 g, Rfl: 3   buPROPion  (WELLBUTRIN  SR) 150 MG 12 hr tablet, Take 150 mg by mouth daily., Disp: , Rfl:    fluticasone -salmeterol (WIXELA INHUB) 100-50 MCG/ACT AEPB, Inhale 1 puff into the lungs 2 (two) times daily., Disp: 60 each, Rfl: 11   metoprolol  tartrate (LOPRESSOR ) 100 MG tablet, Take 100 mg by mouth 2 (two) times daily., Disp: , Rfl:    Subjective:   PATIENT ID: Justin Small GENDER: male DOB: Aug 17, 1991, MRN: [id number]  Chief Complaint  Patient presents with   Follow-up    Follow up: Asthma, restrictive lung disease, CT: 04/19/24, PFT: ?  Dry cough and scratchy throat currently -- since Monday. Hasn't been around any known sick contacts. Hasn't been tested for flu/covid. No wheezing and some SOB/DOE. Currently using albuterol  inhaler which has been helpful.    HPI  Justin Small is a pleasant 33 year old male with a past medical history of childhood onset asthma presenting  to clinic for follow up.  Patient has continued to experience symptoms of shortness of breath with exertion since our last visit. He does not have chest pain or chest tightness. We had prescribed him Symbicort  but this was not covered by his insurance provider and he was  not able to get it. I was able to speak to his mom over the phone today during our visit and she confirmed his childhood onset asthma and multiple respiratory ailments growing up. She did report that he was born at term.   He is referred to  us  from his primary care physician's office for workup of an abnormal pulmonary function test. He was initially seen by his primary care physician for chest discomfort for which he was referred to cardiology.  Workup has included echocardiography which was normal and a coronary CT also noted to be normal and without abnormalities.  He underwent a pulmonary function test (spirometry with bronchodilator challenge, lung volumes, and DLCO) which was noted to have a mild restrictive and obstructive pattern.   Patient reports a history of childhood onset asthma where he recalls requiring inhalers/nebulizers as a child.  He reports a few asthma exacerbations growing up.  He felt that he grew out of his asthma as a young adult and this was not an issue for him over the past few years.  He was prescribed albuterol  by his primary care physician which he is currently using regularly.  Some days he uses it once or twice and other days he uses it 4 or 5 times.  He reports occasional wheezing but denies any constant wheeze or cough productive of sputum.  He does have the occasional chest tightness that is not relieved with the use of albuterol .  He does not have any fevers, chills, night sweats, or weight loss.  He does not have any cough or sputum production.   Review of systems notable for right index finger swelling that occurred a couple years ago and has persisted.  Denies any erythema or pain.  No other joint pains or joint swelling noted.  No swelling in the lower extremities, no skin rashes or skin ulcers, no mouth ulcers, and no eye inflammation reported.  He denies any history of recurrent infections, denies any muscle weakness, denies muscle twitching/fasciculations.   Patient reports a history of smoking, quit 1 year ago.  He started smoking around the age of 19, smoked 1 pack a day until the age of 3 at which point he switched to using nicotine vapes.  He quit vape use was in September 2024.  Overall, he  likely has 10 pack years of smoking history.  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 He reports seasonal allergies.  He also works as a Nature conservation officer at [employer] where he feels symptoms exacerbate secondary to dust.  No family history of asthma is reported.  He reports some secondhand smoke exposure growing up.  He is not sure if he was born at term or preterm.  Ancillary information including prior medications, full medical/surgical/family/social histories, and PFTs (when available) are listed below and have been reviewed.   Review of Systems  Constitutional:  Negative for chills, diaphoresis, fever, malaise/fatigue and weight loss.  HENT:  Negative for sore throat.   Eyes:  Negative for blurred vision, double vision, pain, discharge and redness.  Respiratory:  Positive for shortness of breath. Negative for cough, hemoptysis, sputum production, wheezing and stridor.   Cardiovascular:  Negative for chest pain, palpitations and leg swelling.  Gastrointestinal:  Negative for blood in stool, constipation, diarrhea, heartburn and melena.  Genitourinary:  Negative for dysuria and hematuria.  Musculoskeletal:  Negative for joint pain and myalgias.  Neurological:  Negative for dizziness, focal weakness, loss of  consciousness and weakness.  Endo/Heme/Allergies:  Does not bruise/bleed easily.     Objective:   Vitals:   05/02/24 1608  BP: (!) 102/90  Pulse: (!) 108  Temp: 98.8 F (37.1 C)  TempSrc: Oral  SpO2: 96%  Weight: 233 lb (105.7 kg)  Height: 5\' 10"  (1.778 m)   96% on RA  BMI Readings from Last 3 Encounters:  05/02/24 33.43 kg/m  05/01/24 33.37 kg/m  01/04/24 33.03 kg/m   Wt Readings from Last 3 Encounters:  05/02/24 233 lb (105.7 kg)  05/01/24 232 lb 9.6 oz (105.5 kg)  01/04/24 230 lb 3.2 oz (104.4 kg)    Physical Exam Constitutional:      Appearance: Normal appearance.  HENT:     Head: Normocephalic and atraumatic.      Mouth/Throat:     Mouth: Mucous membranes are moist.  Eyes:     Extraocular Movements: Extraocular movements intact.     Pupils: Pupils are equal, round, and reactive to light.  Cardiovascular:     Rate and Rhythm: Normal rate and regular rhythm.     Pulses: Normal pulses.     Heart sounds: Normal heart sounds.  Pulmonary:     Effort: Pulmonary effort is normal. No respiratory distress.     Breath sounds: Normal breath sounds. No stridor. No wheezing, rhonchi or rales.  Abdominal:     Palpations: Abdomen is soft.  Musculoskeletal:     Right lower leg: No edema.     Left lower leg: No edema.  Neurological:     General: No focal deficit present.     Mental Status: He is alert and oriented to person, place, and time. Mental status is at baseline.       Ancillary Information    Past Medical History:  Diagnosis Date   Asthma    Epididymitis, left 02/27/2019     Family History  Problem Relation Age of Onset   Sleep apnea Mother    Hypertension Father    Heart disease Maternal Grandmother    Cancer Paternal Grandmother      History reviewed. No pertinent surgical history.  Social History   Socioeconomic History   Marital status: Single    Spouse name: Not on file   Number of children: Not on file   Years of education: Not on file   Highest education level: 12th grade  Occupational History   Not on file  Tobacco Use   Smoking status: Every Day    Types: Cigars, Cigarettes    Start date: 04/02/2024    Last attempt to quit: 10/17/2013    Years since quitting: 10.5    Passive exposure: Current   Smokeless tobacco: Never   Tobacco comments:    Recently picked up cigar smoking -- about 6 every day 05/02/24  Vaping Use   Vaping status: Former  Substance and Sexual Activity   Alcohol use: Not Currently   Drug use: No   Sexual activity: Yes    Birth control/protection: Inserts  Other Topics Concern   Not on file  Social History Narrative   Not on file    Social Drivers of Health   Financial Resource Strain: Low Risk  (03/16/2024)   Overall Financial Resource Strain (CARDIA)    Difficulty of Paying Living Expenses: Not hard at all  Food Insecurity: Food Insecurity Present (03/16/2024)   Hunger Vital Sign    Worried About Running Out of Food in the Last Year: Sometimes true    Ran  Out of Food in the Last Year: Never true  Transportation Needs: No Transportation Needs (03/16/2024)   PRAPARE - Administrator, Civil Service (Medical): No    Lack of Transportation (Non-Medical): No  Physical Activity: Unknown (03/16/2024)   Exercise Vital Sign    Days of Exercise per Week: 0 days    Minutes of Exercise per Session: Not on file  Stress: No Stress Concern Present (03/16/2024)   [employer] of Occupational Health - Occupational Stress Questionnaire    Feeling of Stress : Only a little  Social Connections: Socially Isolated (03/16/2024)   Social Connection and Isolation Panel [NHANES]    Frequency of Communication with Friends and Family: Once a week    Frequency of Social Gatherings with Friends and Family: Never    Attends Religious Services: Never    Database administrator or Organizations: No    Attends Engineer, structural: Not on file    Marital Status: Married  Intimate Partner Violence: Not on file     Allergies  Allergen Reactions   Amoxicillin Swelling   Ibuprofen  Swelling   Trazodone And Nefazodone Swelling    Throat swelling   Penicillins Swelling     CBC    Component Value Date/Time   WBC 7.1 07/19/2023 1647   RBC 5.26 07/19/2023 1647   HGB 14.5 07/19/2023 1647   HCT 44.1 07/19/2023 1647   PLT 280 07/19/2023 1647   MCV 83.8 07/19/2023 1647   MCH 27.6 07/19/2023 1647   MCHC 32.9 07/19/2023 1647   RDW 14.1 07/19/2023 1647    Pulmonary Functions Testing Results:    Latest Ref Rng & Units 05/01/2024    3:18 PM 11/01/2023    4:37 PM  PFT Results  FVC-Pre L 3.68  3.31   FVC-Predicted  Pre % 67  60   FVC-Post L 3.42  3.53   FVC-Predicted Post % 62  64   Pre FEV1/FVC % % 85  87   Post FEV1/FCV % % 88  88   FEV1-Pre L 3.11  2.87   FEV1-Predicted Pre % 70  64   FEV1-Post L 3.00  3.12   DLCO uncorrected ml/min/mmHg 23.62  21.90   DLCO UNC% % 71  67   DLVA Predicted % 115  101   TLC L 4.48  5.07   TLC % Predicted % 64  73   RV % Predicted % 79  108     Outpatient Medications Prior to Visit  Medication Sig Dispense Refill   albuterol  (VENTOLIN  HFA) 108 (90 Base) MCG/ACT inhaler Inhale 2 puffs into the lungs every 6 (six) hours as needed for wheezing or shortness of breath. 18 g 3   buPROPion  (WELLBUTRIN  SR) 150 MG 12 hr tablet Take 150 mg by mouth daily.     metoprolol  tartrate (LOPRESSOR ) 100 MG tablet Take 100 mg by mouth 2 (two) times daily.     fluticasone -salmeterol (ADVAIR DISKUS) 250-50 MCG/ACT AEPB Inhale 1 puff into the lungs in the morning and at bedtime. 60 each 11   No facility-administered medications prior to visit.

## 2024-05-04 ENCOUNTER — Other Ambulatory Visit: Payer: Self-pay

## 2024-05-04 DIAGNOSIS — J4520 Mild intermittent asthma, uncomplicated: Secondary | ICD-10-CM

## 2024-05-04 DIAGNOSIS — J984 Other disorders of lung: Secondary | ICD-10-CM

## 2024-05-07 ENCOUNTER — Ambulatory Visit: Admitting: Student in an Organized Health Care Education/Training Program

## 2024-05-07 DIAGNOSIS — J4520 Mild intermittent asthma, uncomplicated: Secondary | ICD-10-CM

## 2024-05-07 DIAGNOSIS — J984 Other disorders of lung: Secondary | ICD-10-CM

## 2024-05-07 NOTE — Progress Notes (Signed)
 MIP/MEP performed today

## 2024-05-07 NOTE — Patient Instructions (Signed)
 MIP/MEP performed today

## 2024-05-22 ENCOUNTER — Telehealth: Payer: Self-pay

## 2024-05-22 DIAGNOSIS — J984 Other disorders of lung: Secondary | ICD-10-CM

## 2024-05-22 DIAGNOSIS — J4520 Mild intermittent asthma, uncomplicated: Secondary | ICD-10-CM

## 2024-05-22 MED ORDER — FLUTICASONE-SALMETEROL 100-50 MCG/ACT IN AEPB: 1.0000 | INHALATION_SPRAY | Freq: Two times a day (BID) | RESPIRATORY_TRACT | 11 refills | Status: AC

## 2024-05-22 NOTE — Telephone Encounter (Signed)
 Copied from CRM [id number]. Topic: General - Other >> May 21, 2024  2:47 PM Eveleen Hinds B wrote: Reason for CRM: Patient called in stating he was prescribed a medication by Dr Odilia Bennett. States. Pharmacy told patient the script expired on 5/26 and he did not pick it up. Patient is unsure what the medication was. Would like a call from the office to see what it was and if he can get it refilled.  Per lov summary with Dr. Darnelle Elders stated Zachery Hermes was sent to pts pharmacy.  Resending rx Pt is aware.  Nothing further needed.

## 2024-06-12 ENCOUNTER — Ambulatory Visit: Payer: Self-pay | Admitting: Student in an Organized Health Care Education/Training Program

## 2024-07-30 ENCOUNTER — Ambulatory Visit (INDEPENDENT_AMBULATORY_CARE_PROVIDER_SITE_OTHER): Admitting: Student in an Organized Health Care Education/Training Program

## 2024-07-30 ENCOUNTER — Encounter: Payer: Self-pay | Admitting: Student in an Organized Health Care Education/Training Program

## 2024-07-30 DIAGNOSIS — J4520 Mild intermittent asthma, uncomplicated: Secondary | ICD-10-CM | POA: Diagnosis not present

## 2024-07-30 MED ORDER — ALBUTEROL SULFATE HFA 108 (90 BASE) MCG/ACT IN AERS: 2.0000 | INHALATION_SPRAY | Freq: Four times a day (QID) | RESPIRATORY_TRACT | 6 refills | Status: AC | PRN

## 2024-07-31 NOTE — Progress Notes (Signed)
 Assessment & Plan:   1. Mild intermittent asthma without complication  Patient is a pleasant 33 year old male with past medical history of childhood onset asthma presenting for follow up.   His PFT's had shown a mixed obstructive and restrictive defect, with some bronchodilator response. We finally landed on Wixela as an inhaler and he's been compliant with it. Today, he reports significant symptomatic improvement with it. His PFT's had previously also shown a drop in TLC and DLCO suggestive of restriction. MIP/MEP performed on follow up PFT and this was re-assuring.   The differential for this includes interstitial lung disease (HRCT does not show ILD), pulmonary hypertension (TTE normal), and neuromuscular weakness (no diaphragmatic weakness, MIP/MEP normal).  There is the possibility that he has superimposed/underlying asthma with remodeling and resultant restriction. He could have also developed lung damage secondary to smoking as well. Given improvement in symptoms with ICS/LABA, we will continue with this and re-evaluate his PFT's on follow up.   - albuterol  (VENTOLIN  HFA) 108 (90 Base) MCG/ACT inhaler; Inhale 2 puffs into the lungs every 6 (six) hours as needed for wheezing or shortness of breath.  Dispense: 18 g; Refill: 6 - Continue Wixela 100-50 one puff twice daily   Return in about 6 months (around 01/30/2025).  I spent 33 minutes caring for this patient today, including preparing to see the patient, obtaining a medical history , reviewing a separately obtained history, performing a medically appropriate examination and/or evaluation, counseling and educating the patient/family/caregiver, ordering medications, tests, or procedures, documenting clinical information in the electronic health record, and independently interpreting results (not separately reported/billed) and communicating results to the patient/family/caregiver  Justin November, MD [facility]  End  of visit medications:  Meds ordered this encounter  Medications   albuterol  (VENTOLIN  HFA) 108 (90 Base) MCG/ACT inhaler    Sig: Inhale 2 puffs into the lungs every 6 (six) hours as needed for wheezing or shortness of breath.    Dispense:  18 g    Refill:  6    GENERIC IS ACCEPTABLE  (hence the original order was NOT marked dispense as written; Order number [id number]).     Current Outpatient Medications:    buPROPion  (WELLBUTRIN  SR) 150 MG 12 hr tablet, Take 150 mg by mouth daily., Disp: , Rfl:    fluticasone -salmeterol (WIXELA INHUB) 100-50 MCG/ACT AEPB, Inhale 1 puff into the lungs 2 (two) times daily., Disp: 60 each, Rfl: 11   metoprolol  tartrate (LOPRESSOR ) 100 MG tablet, Take 100 mg by mouth 2 (two) times daily., Disp: , Rfl:    albuterol  (VENTOLIN  HFA) 108 (90 Base) MCG/ACT inhaler, Inhale 2 puffs into the lungs every 6 (six) hours as needed for wheezing or shortness of breath., Disp: 18 g, Rfl: 6   Subjective:   PATIENT ID: Justin Small GENDER: male DOB: July 08, 1991, MRN: [id number]  Chief Complaint  Patient presents with   Follow-up    DOE. No wheezing. Cough,dry.    HPI  Justin Small is a pleasant 33 year old male with a past medical history of childhood onset asthma presenting for follow up.  Return Visit 05/02/2024:   Patient has continued to experience symptoms of shortness of breath with exertion since our last visit. He does not have chest pain or chest tightness. We had prescribed him Symbicort  but this was not covered by his insurance provider and he was not able to get it. I was able to speak to his mom over the phone today during our  visit and she confirmed his childhood onset asthma and multiple respiratory ailments growing up. She did report that he was born at term.   He is referred to us  from his primary care physician's office for workup of an abnormal pulmonary function test. He was initially seen by his primary care physician for chest discomfort for  which he was referred to cardiology.  Workup has included echocardiography which was normal and a coronary CT also noted to be normal and without abnormalities.  He underwent a pulmonary function test (spirometry with bronchodilator challenge, lung volumes, and DLCO) which was noted to have a mild restrictive and obstructive pattern.   Patient reports a history of childhood onset asthma where he recalls requiring inhalers/nebulizers as a child.  He reports a few asthma exacerbations growing up.  He felt that he grew out of his asthma as a young adult and this was not an issue for him over the past few years.  He was prescribed albuterol  by his primary care physician which he is currently using regularly.  Some days he uses it once or twice and other days he uses it 4 or 5 times.  He reports occasional wheezing but denies any constant wheeze or cough productive of sputum.  He does have the occasional chest tightness that is not relieved with the use of albuterol .  He does not have any fevers, chills, night sweats, or weight loss.  He does not have any cough or sputum production.   Review of systems notable for right index finger swelling that occurred a couple years ago and has persisted.  Denies any erythema or pain.  No other joint pains or joint swelling noted.  No swelling in the lower extremities, no skin rashes or skin ulcers, no mouth ulcers, and no eye inflammation reported.  He denies any history of recurrent infections, denies any muscle weakness, denies muscle twitching/fasciculations.  Return Visit 07/31/2024:  Feels much better with his inhaler therapy, and feels less short of breath. He is not using a rescue inhaler. He has less cough, less wheeze, and less dyspnea. No fevers, chills, or night sweats reported. He had his PFT's, and the MIP/MEP, and is here to discuss results.   Patient reports a history of smoking, quit in 2024.  He started smoking around the age of 75, smoked 1 pack a day  until the age of 11 at which point he switched to using nicotine vapes.  He quit vape use in September 2024.  Overall, he likely has 10 pack years of smoking history.  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 He reports seasonal allergies.  He also works as a Nature conservation officer at [employer] where he feels symptoms exacerbate secondary to dust.  No family history of asthma is reported.  He reports some secondhand smoke exposure growing up.  He is not sure if he was born at term or preterm.  Ancillary information including prior medications, full medical/surgical/family/social histories, and PFTs (when available) are listed below and have been reviewed.   Review of Systems  Constitutional:  Negative for chills, diaphoresis, fever, malaise/fatigue and weight loss.  HENT:  Negative for sore throat.   Eyes:  Negative for blurred vision, double vision, pain, discharge and redness.  Respiratory:  Negative for cough, hemoptysis, sputum production, shortness of breath, wheezing and stridor.   Cardiovascular:  Negative for chest pain, palpitations and leg swelling.  Gastrointestinal:  Negative for blood in stool, constipation,  diarrhea, heartburn and melena.  Genitourinary:  Negative for dysuria and hematuria.  Musculoskeletal:  Negative for joint pain and myalgias.  Neurological:  Negative for dizziness, focal weakness, loss of consciousness and weakness.  Endo/Heme/Allergies:  Does not bruise/bleed easily.     Objective:   Vitals:   07/30/24 1611  BP: 134/84  Pulse: 99  Temp: (!) 97.3 F (36.3 C)  SpO2: 97%  Weight: 231 lb (104.8 kg)  Height: 5' 10 (1.778 m)   97% on RA  BMI Readings from Last 3 Encounters:  07/30/24 33.15 kg/m  05/02/24 33.43 kg/m  05/01/24 33.37 kg/m   Wt Readings from Last 3 Encounters:  07/30/24 231 lb (104.8 kg)  05/02/24 233 lb (105.7 kg)  05/01/24 232 lb 9.6 oz (105.5 kg)    Physical Exam Constitutional:      Appearance:  Normal appearance.  HENT:     Head: Normocephalic and atraumatic.     Mouth/Throat:     Mouth: Mucous membranes are moist.  Eyes:     Extraocular Movements: Extraocular movements intact.     Pupils: Pupils are equal, round, and reactive to light.  Cardiovascular:     Rate and Rhythm: Normal rate and regular rhythm.     Pulses: Normal pulses.     Heart sounds: Normal heart sounds.  Pulmonary:     Effort: Pulmonary effort is normal. No respiratory distress.     Breath sounds: Normal breath sounds. No stridor. No wheezing, rhonchi or rales.  Abdominal:     Palpations: Abdomen is soft.  Musculoskeletal:     Right lower leg: No edema.     Left lower leg: No edema.  Neurological:     General: No focal deficit present.     Mental Status: He is alert and oriented to person, place, and time. Mental status is at baseline.       Ancillary Information    Past Medical History:  Diagnosis Date   Asthma    Epididymitis, left 02/27/2019     Family History  Problem Relation Age of Onset   Sleep apnea Mother    Hypertension Father    Heart disease Maternal Grandmother    Cancer Paternal Grandmother      History reviewed. No pertinent surgical history.  Social History   Socioeconomic History   Marital status: Single    Spouse name: Not on file   Number of children: Not on file   Years of education: Not on file   Highest education level: 12th grade  Occupational History   Not on file  Tobacco Use   Smoking status: Every Day    Types: Cigars, Cigarettes    Start date: 04/02/2024    Last attempt to quit: 10/17/2013    Years since quitting: 10.7    Passive exposure: Current   Smokeless tobacco: Never   Tobacco comments:    Recently picked up cigar smoking -- about 4 every day - khj 07/30/2024  Vaping Use   Vaping status: Former  Substance and Sexual Activity   Alcohol use: Not Currently   Drug use: No   Sexual activity: Yes    Birth control/protection: Inserts  Other  Topics Concern   Not on file  Social History Narrative   Not on file   Social Drivers of Health   Financial Resource Strain: Low Risk  (03/16/2024)   Overall Financial Resource Strain (CARDIA)    Difficulty of Paying Living Expenses: Not hard at all  Food Insecurity: Food  Insecurity Present (03/16/2024)   Hunger Vital Sign    Worried About Running Out of Food in the Last Year: Sometimes true    Ran Out of Food in the Last Year: Never true  Transportation Needs: No Transportation Needs (03/16/2024)   PRAPARE - Administrator, Civil Service (Medical): No    Lack of Transportation (Non-Medical): No  Physical Activity: Unknown (03/16/2024)   Exercise Vital Sign    Days of Exercise per Week: 0 days    Minutes of Exercise per Session: Not on file  Stress: No Stress Concern Present (03/16/2024)   [employer] of Occupational Health - Occupational Stress Questionnaire    Feeling of Stress : Only a little  Social Connections: Socially Isolated (03/16/2024)   Social Connection and Isolation Panel    Frequency of Communication with Friends and Family: Once a week    Frequency of Social Gatherings with Friends and Family: Never    Attends Religious Services: Never    Database administrator or Organizations: No    Attends Engineer, structural: Not on file    Marital Status: Married  Intimate Partner Violence: Not on file     Allergies  Allergen Reactions   Amoxicillin Swelling   Ibuprofen  Swelling   Trazodone And Nefazodone Swelling    Throat swelling   Penicillins Swelling     CBC    Component Value Date/Time   WBC 7.1 07/19/2023 1647   RBC 5.26 07/19/2023 1647   HGB 14.5 07/19/2023 1647   HCT 44.1 07/19/2023 1647   PLT 280 07/19/2023 1647   MCV 83.8 07/19/2023 1647   MCH 27.6 07/19/2023 1647   MCHC 32.9 07/19/2023 1647   RDW 14.1 07/19/2023 1647    Pulmonary Functions Testing Results:    Latest Ref Rng & Units 05/01/2024    3:18 PM 11/01/2023     4:37 PM  PFT Results  FVC-Pre L 3.68  3.31   FVC-Predicted Pre % 67  60   FVC-Post L 3.42  3.53   FVC-Predicted Post % 62  64   Pre FEV1/FVC % % 85  87   Post FEV1/FCV % % 88  88   FEV1-Pre L 3.11  2.87   FEV1-Predicted Pre % 70  64   FEV1-Post L 3.00  3.12   DLCO uncorrected ml/min/mmHg 23.62  21.90   DLCO UNC% % 71  67   DLVA Predicted % 115  101   TLC L 4.48  5.07   TLC % Predicted % 64  73   RV % Predicted % 79  108     Outpatient Medications Prior to Visit  Medication Sig Dispense Refill   buPROPion  (WELLBUTRIN  SR) 150 MG 12 hr tablet Take 150 mg by mouth daily.     fluticasone -salmeterol (WIXELA INHUB) 100-50 MCG/ACT AEPB Inhale 1 puff into the lungs 2 (two) times daily. 60 each 11   metoprolol  tartrate (LOPRESSOR ) 100 MG tablet Take 100 mg by mouth 2 (two) times daily.     albuterol  (VENTOLIN  HFA) 108 (90 Base) MCG/ACT inhaler Inhale 2 puffs into the lungs every 6 (six) hours as needed for wheezing or shortness of breath. (Patient not taking: Reported on 07/30/2024) 18 g 3   No facility-administered medications prior to visit.

## 2024-09-10 ENCOUNTER — Ambulatory Visit: Admitting: Family Medicine

## 2024-09-27 ENCOUNTER — Ambulatory Visit: Admitting: Family Medicine

## 2024-09-27 ENCOUNTER — Encounter: Payer: Self-pay | Admitting: Family Medicine

## 2024-09-27 VITALS — BP 145/85 | HR 100 | Ht 70.0 in | Wt 228.0 lb

## 2024-09-27 DIAGNOSIS — W57XXXA Bitten or stung by nonvenomous insect and other nonvenomous arthropods, initial encounter: Secondary | ICD-10-CM | POA: Diagnosis not present

## 2024-09-27 DIAGNOSIS — L03115 Cellulitis of right lower limb: Secondary | ICD-10-CM

## 2024-09-27 MED ORDER — TRIAMCINOLONE ACETONIDE 0.5 % EX OINT: 1.0000 | TOPICAL_OINTMENT | Freq: Two times a day (BID) | CUTANEOUS | 0 refills | Status: AC

## 2024-09-27 MED ORDER — DOXYCYCLINE HYCLATE 100 MG PO TABS: 100.0000 mg | ORAL_TABLET | Freq: Two times a day (BID) | ORAL | 0 refills | Status: AC

## 2024-09-27 NOTE — Progress Notes (Signed)
 Acute visit   Patient: Justin Small   DOB: 10-Apr-1991   32 y.o. Male  MRN: [id number] PCP: Donzella Lauraine SAILOR, DO   Chief Complaint  Patient presents with   Acute Visit    Patient is present due to swelling around bug bite   Insect Bite    Bug bite present on right leg X 1 month. Swelling started 2 days ago associated with redness and warm to the touch. NO fluids or discharge from site. Reports coloration is different but no difference in size. Has used triple antibiotics on site. Has not used any today.    Subjective    Discussed the use of AI scribe software for clinical note transcription with the patient, who gave verbal consent to proceed.  History of Present Illness   Justin Small is a 33 year old male who presents with a red, swollen bump on his right leg following a bug bite.  Approximately one month ago, he noticed a bump on his right leg. Two days ago, the bump became red, warm, and swollen. It is tender, and some bites are itchy, particularly two of them. There is no oozing from the site.  He experiences occasional swelling in his ankle. The type of bug that bit him is unknown.  He has an allergy to penicillin, discovered after an allergic reaction when combined with Motrin  during a dental visit.  He has no fevers, nausea, or vomiting.        Review of Systems  Objective    BP (!) 145/85 (BP Location: Left Arm, Patient Position: Sitting)   Pulse 100   Ht 5' 10 (1.778 m)   Wt 228 lb (103.4 kg)   SpO2 100%   BMI 32.71 kg/m    Physical Exam Constitutional:      General: He is not in acute distress.    Appearance: Normal appearance. He is not diaphoretic.  HENT:     Head: Normocephalic.  Eyes:     Conjunctiva/sclera: Conjunctivae normal.  Pulmonary:     Effort: Pulmonary effort is normal. No respiratory distress.  Skin:    Comments: Hyperpigmented induraton areas on inner R thigh Erthema around induration and TTP on lower R calf  Neurological:      Mental Status: He is alert and oriented to person, place, and time. Mental status is at baseline.        No results found for any visits on 09/27/24.  Assessment & Plan     Problem List Items Addressed This Visit   None Visit Diagnoses       Cellulitis of right lower extremity    -  Primary     Bug bite, initial encounter               Cellulitis of right lower leg Cellulitis of the right lower leg, presenting with redness, warmth, and swelling, likely secondary to a bug bite. No fever, nausea, or vomiting. Absence of drainage indicates no pus pocket. Redness and tenderness expected to improve with treatment. - Prescribe doxycycline for the infection. - Advise taking doxycycline with food to prevent stomach upset. - Counsel on potential sun sensitivity with doxycycline and recommend sunscreen use if exposed to sun. - Send prescription to [facility] in [place].  Pruritic bug bites of right lower leg Multiple bug bites on the right lower leg, with some areas darkened and itchy, likely due to scratching. No significant redness or swelling in most bites except for the  one associated with cellulitis. - Prescribe triamcinolone ointment to apply topically twice a day to the itchy areas.       Meds ordered this encounter  Medications   doxycycline (VIBRA-TABS) 100 MG tablet    Sig: Take 1 tablet (100 mg total) by mouth 2 (two) times daily for 7 days.    Dispense:  14 tablet    Refill:  0   triamcinolone ointment (KENALOG) 0.5 %    Sig: Apply 1 Application topically 2 (two) times daily.    Dispense:  30 g    Refill:  0     Return if symptoms worsen or fail to improve.      Jon Eva, MD  [facility] [phone] (phone) [phone] (fax)  [facility]

## 2024-10-26 ENCOUNTER — Ambulatory Visit: Payer: Self-pay

## 2024-10-26 NOTE — Telephone Encounter (Signed)
 FYI Only or Action Required?: FYI only for provider: Pt will go to UC.  Patient was last seen in primary care on 09/27/2024 by Myrla Jon HERO, MD.  Called Nurse Triage reporting Wound Infection.Area is increasing in size. Lines going from wound. Foot is very swollen  Symptoms began about a month ago.  Interventions attempted: Other: seen in office.  Symptoms are: gradually worsening.  Triage Disposition: See HCP Within 4 Hours (Or PCP Triage)  Patient/caregiver understands and will follow disposition?: Yes                          Copied from CRM #[id number]. Topic: Clinical - Red Word Triage >> Oct 26, 2024 11:44 AM Montie POUR wrote: Red Word that prompted transfer to Nurse Triage:  Eulogio is calling because his cellulitis in his right leg is getting worse. He is wanting to schedule an appointment. Reason for Disposition  [1] Skin around the wound has become red AND [2] larger than 2 inches (5 cm)  Answer Assessment - Initial Assessment Questions 1. LOCATION: Where is the wound located?      Right leg 2. WOUND APPEARANCE: What does the wound look like?      Whole foot is swollen 3. SIZE: If redness is present, ask: What is the size of the red area? (Inches, centimeters, or compare to size of a coin)      2-3 inches 4. SPREAD: What's changed in the last day?  Do you see any red streaks coming from the wound?     yes 5. ONSET: When did it start to look infected?      October 6. MECHANISM: How did the wound start, what was the cause?     Bug bug 7. PAIN: Is there any pain? If Yes, ask: How bad is the pain?   (Scale 1-10; or mild, moderate, severe)     Only when bend legs 8. FEVER: Do you have a fever? If Yes, ask: What is your temperature, how was it measured, and when did it start?     Wound feels warm 9. OTHER SYMPTOMS: Do you have any other symptoms? (e.g., shaking chills, weakness, rash elsewhere on body)     no  Protocols  used: Wound Infection-A-AH

## 2024-11-06 ENCOUNTER — Ambulatory Visit
Admission: EM | Admit: 2024-11-06 | Discharge: 2024-11-06 | Disposition: A | Discharge status: Home / Self Care | Attending: Emergency Medicine | Admitting: Emergency Medicine

## 2024-11-06 ENCOUNTER — Emergency Department
Admission: EM | Admit: 2024-11-06 | Discharge: 2024-11-07 | Disposition: A | Discharge status: Home / Self Care | Attending: Emergency Medicine | Admitting: Emergency Medicine

## 2024-11-06 ENCOUNTER — Emergency Department

## 2024-11-06 ENCOUNTER — Other Ambulatory Visit: Payer: Self-pay

## 2024-11-06 DIAGNOSIS — J45909 Unspecified asthma, uncomplicated: Secondary | ICD-10-CM | POA: Insufficient documentation

## 2024-11-06 DIAGNOSIS — M7989 Other specified soft tissue disorders: Secondary | ICD-10-CM

## 2024-11-06 DIAGNOSIS — M79604 Pain in right leg: Secondary | ICD-10-CM

## 2024-11-06 DIAGNOSIS — L03115 Cellulitis of right lower limb: Secondary | ICD-10-CM

## 2024-11-06 DIAGNOSIS — L989 Disorder of the skin and subcutaneous tissue, unspecified: Secondary | ICD-10-CM | POA: Diagnosis present

## 2024-11-06 LAB — CBC WITH DIFFERENTIAL/PLATELET
Abs Immature Granulocytes: 0.01 K/uL (ref 0.00–0.07)
Basophils Absolute: 0 K/uL (ref 0.0–0.1)
Basophils Relative: 0 %
Eosinophils Absolute: 0.1 K/uL (ref 0.0–0.5)
Eosinophils Relative: 1 %
HCT: 45.7 % (ref 39.0–52.0)
Hemoglobin: 15.3 g/dL (ref 13.0–17.0)
Immature Granulocytes: 0 %
Lymphocytes Relative: 43 %
Lymphs Abs: 3.3 K/uL (ref 0.7–4.0)
MCH: 28.1 pg (ref 26.0–34.0)
MCHC: 33.5 g/dL (ref 30.0–36.0)
MCV: 83.9 fL (ref 80.0–100.0)
Monocytes Absolute: 0.6 K/uL (ref 0.1–1.0)
Monocytes Relative: 8 %
Neutro Abs: 3.7 K/uL (ref 1.7–7.7)
Neutrophils Relative %: 48 %
Platelets: 325 K/uL (ref 150–400)
RBC: 5.45 MIL/uL (ref 4.22–5.81)
RDW: 14.3 % (ref 11.5–15.5)
WBC: 7.7 K/uL (ref 4.0–10.5)
nRBC: 0 % (ref 0.0–0.2)

## 2024-11-06 LAB — BASIC METABOLIC PANEL WITH GFR
Anion gap: 14 (ref 5–15)
BUN: 9 mg/dL (ref 6–20)
CO2: 26 mmol/L (ref 22–32)
Calcium: 10.2 mg/dL (ref 8.9–10.3)
Chloride: 102 mmol/L (ref 98–111)
Creatinine, Ser: 1 mg/dL (ref 0.61–1.24)
GFR, Estimated: >60 mL/min (ref >60)
Glucose, Bld: 110 mg/dL — ABNORMAL HIGH (ref 70–99)
Potassium: 3.7 mmol/L (ref 3.5–5.1)
Sodium: 142 mmol/L (ref 135–145)

## 2024-11-06 MED ORDER — SULFAMETHOXAZOLE-TRIMETHOPRIM 800-160 MG PO TABS
1.0000 | ORAL_TABLET | Freq: Two times a day (BID) | ORAL | 0 refills | Status: AC
Start: 2024-11-06 — End: 2024-11-16

## 2024-11-06 MED ORDER — CEPHALEXIN 500 MG PO CAPS: 1000.0000 mg | ORAL_CAPSULE | Freq: Two times a day (BID) | ORAL | 0 refills | Status: AC

## 2024-11-06 NOTE — ED Triage Notes (Signed)
 Pt c/o possible cellulitis along lower right leg  Pt states that he was seen 2 months ago and was given antibiotics that did not help  Pt was told to go to urgent care for ongoing symptoms but did not go.  Pt states that the infected area on the lower leg has now become bigger.

## 2024-11-06 NOTE — ED Provider Notes (Signed)
 [facility] Provider Note   Event Date/Time   First MD Initiated Contact with Patient 11/06/24 2111     (approximate) History  Leg Pain  HPI Justin Small is a 33 y.o. male with a past medical history of asthma who presents complaining of of lesion to the right lower leg with redness concerning for cellulitis and was sent from urgent care to rule out DVT.  Patient states that he was seen recently and put on a course of doxycycline  however he did not finish it.  Patient states that this redness has been spreading circumferentially from where they marked it previously.  Patient denies any significant swelling in this lower extremity.  Patient endorses pain without any purulent drainage ROS: Patient currently denies any vision changes, tinnitus, difficulty speaking, facial droop, sore throat, chest pain, shortness of breath, abdominal pain, nausea/vomiting/diarrhea, dysuria, or weakness/numbness/paresthesias in any extremity   Physical Exam  Triage Vital Signs: ED Triage Vitals  Encounter Vitals Group     BP 11/06/24 2000 (!) 156/100     Girls Systolic BP Percentile --      Girls Diastolic BP Percentile --      Boys Systolic BP Percentile --      Boys Diastolic BP Percentile --      Pulse Rate 11/06/24 2000 (!) 115     Resp 11/06/24 2000 20     Temp 11/06/24 2000 98.6 F (37 C)     Temp src --      SpO2 11/06/24 2000 97 %     Weight 11/06/24 2000 230 lb (104.3 kg)     Height 11/06/24 2000 5' 10 (1.778 m)     Head Circumference --      Peak Flow --      Pain Score 11/06/24 1959 0     Pain Loc --      Pain Education --      Exclude from Growth Chart --    Most recent vital signs: Vitals:   11/06/24 2000  BP: (!) 156/100  Pulse: (!) 115  Resp: 20  Temp: 98.6 F (37 C)  SpO2: 97%   General: Awake, oriented x4. CV:  Good peripheral perfusion. Resp:  Normal effort. Abd:  No distention. Other:  Middle-aged overweight African-American male resting  comfortably in no acute distress.  Approximately 5 cm x 4 cm area of erythema and induration to the anterior medial aspect midway down the right lower leg ED Results / Procedures / Treatments  Labs (all labs ordered are listed, but only abnormal results are displayed) Labs Reviewed  BASIC METABOLIC PANEL WITH GFR - Abnormal; Notable for the following components:      Result Value   Glucose, Bld 110 (*)    All other components within normal limits  CBC WITH DIFFERENTIAL/PLATELET   RADIOLOGY ED MD interpretation: Pending - All radiology independently interpreted and agree with radiology assessment Official radiology report(s): No results found. PROCEDURES: Critical Care performed: No Procedures MEDICATIONS ORDERED IN ED: Medications - No data to display IMPRESSION / MDM / ASSESSMENT AND PLAN / ED COURSE  I reviewed the triage vital signs and the nursing notes.                             The patient is on the cardiac monitor to evaluate for evidence of arrhythmia and/or significant heart rate changes. Patient's presentation is most consistent with acute presentation with potential  threat to life or bodily function. Patient is a 33 year old male with the above-stated past medical history who presents with parents for possible right lower extremity DVT in the setting of right lower extremity cellulitis. DDx: Right lower extremity DVT, cellulitis, sepsis, abscess Plan: CBC, BMP, right lower extremity Doppler ultrasound  Care of this patient will be signed out to the oncoming physician at the end of my shift.  All pertinent patient information conveyed and all questions answered.  All further care and disposition decisions will be made by the oncoming physician.   FINAL CLINICAL IMPRESSION(S) / ED DIAGNOSES   Final diagnoses:  None   Rx / DC Orders   ED Discharge Orders     None      Note:  This document was prepared using Dragon voice recognition software and may include  unintentional dictation errors.   Dwayne Begay K, MD 11/09/24 1021

## 2024-11-06 NOTE — ED Provider Notes (Signed)
 HPI  SUBJECTIVE:  Justin Small is a 33 y.o. male who presents with 2 months of erythema, induration along his medial distal right lower extremity, right lower extremity edema.  He states the erythema started getting bigger 2 weeks ago.  Denies trauma, bug bite, blisters, pustules or drainage, body aches, fevers, color or temperature changes of the leg or foot.  He was seen by his PCP on 10/9 for multiple bug bites with resulting cellulitis, and was put on 7 days of doxycycline without any improvement in his symptoms.  He states the edema is better with elevation and worse at the end of the day.  He is on his feet for prolonged periods of time at work.  Elevation helps.  Symptoms are worse with walking and standing.  No history of DVT/PE, PAD/PVD, hypercholesterolemia, diabetes, venous insufficiency.  He is a smoker, has a history of asthma and hypercholesterolemia.  PCP: [facility]  Past Medical History:  Diagnosis Date   Asthma    Epididymitis, left 02/27/2019    History reviewed. No pertinent surgical history.  Family History  Problem Relation Age of Onset   Sleep apnea Mother    Hypertension Father    Heart disease Maternal Grandmother    Cancer Paternal Grandmother     Social History   Tobacco Use   Smoking status: Every Day    Types: Cigars, Cigarettes    Start date: 04/02/2024    Last attempt to quit: 10/17/2013    Years since quitting: 11.0    Passive exposure: Current   Smokeless tobacco: Never   Tobacco comments:    Recently picked up cigar smoking -- about 4 every day - khj 07/30/2024  Vaping Use   Vaping status: Former  Substance Use Topics   Alcohol use: Not Currently   Drug use: No    No current facility-administered medications for this encounter.  Current Outpatient Medications:    albuterol  (VENTOLIN  HFA) 108 (90 Base) MCG/ACT inhaler, Inhale 2 puffs into the lungs every 6 (six) hours as needed for wheezing or shortness of breath., Disp:  18 g, Rfl: 6   buPROPion  (WELLBUTRIN  SR) 150 MG 12 hr tablet, Take 150 mg by mouth daily., Disp: , Rfl:    fluticasone -salmeterol (WIXELA INHUB) 100-50 MCG/ACT AEPB, Inhale 1 puff into the lungs 2 (two) times daily., Disp: 60 each, Rfl: 11   metoprolol  tartrate (LOPRESSOR ) 100 MG tablet, Take 100 mg by mouth 2 (two) times daily., Disp: , Rfl:    triamcinolone ointment (KENALOG) 0.5 %, Apply 1 Application topically 2 (two) times daily., Disp: 30 g, Rfl: 0  Allergies  Allergen Reactions   Amoxicillin Swelling   Ibuprofen  Swelling   Trazodone And Nefazodone Swelling    Throat swelling   Penicillins Swelling     ROS  As noted in HPI.   Physical Exam  BP (!) 144/99 (BP Location: Left Arm)   Pulse 99   Temp 98.5 F (36.9 C)   Wt 104.9 kg   SpO2 97%   BMI 33.17 kg/m   Constitutional: Well developed, well nourished, no acute distress Eyes:  EOMI, conjunctiva normal bilaterally HENT: Normocephalic, atraumatic,mucus membranes moist Respiratory: Normal inspiratory effort Cardiovascular: Normal rate GI: nondistended skin: 9 x 13 cm tender area of blanchable erythema medial right lower extremity with induration.  Skin intact.  Diffuse calf tenderness.  1+ pitting edema lower extremity.  Foot swollen, normal color and temperature compared to other.  DP 2+.  Right calf 43 cm Left  calf 41 cm     Musculoskeletal: no deformities Neurologic: Alert & oriented x 3, no focal neuro deficits Psychiatric: Speech and behavior appropriate   ED Course   Medications - No data to display  No orders of the defined types were placed in this encounter.   No results found for this or any previous visit (from the past 24 hours). No results found.  ED Clinical Impression  1. Calf swelling      ED Assessment/Plan    Outside records reviewed.  As noted in HPI.  I am concerned about DVT versus venous insufficiency.  Favor venous insufficiency because the swelling gets worse at the  end of the day and better with elevation, however, his right calf is 3 cm bigger than the other 1 and diffusely tender/swollen.  Unfortunately, we do not have ultrasound here today to evaluate for a DVT or venous/arterial insufficiency.  Transferring to the emergency department to evaluate for these entities.  He is stable to go by private vehicle.  Lower suspicion for cellulitis given its duration of 2 months and nonresponse to antibiotics.  Discussed rationale for transfer to the emergency department with the patient.  He agrees to go to [facility].  No orders of the defined types were placed in this encounter.     *This clinic note was created using Dragon dictation software. Therefore, there may be occasional mistakes despite careful proofreading.  ?    Van Knee, MD 11/06/24 1729

## 2024-11-06 NOTE — Discharge Instructions (Signed)
 I suspect that you have a circulation problem and that leg has venous or arterial insufficiency, but I am concerned that you may have a blood clot in your leg.  I have a low suspicion for cellulitis, although this is still a possibility.  Please go to the emergency department to rule out DVT.  We unfortunately do not have ultrasound available at this time.

## 2024-11-06 NOTE — ED Triage Notes (Signed)
 Pt reports lower right leg pain and redness, pt reports he has had cellulitis in same leg previously. Pt reports he was sent here from [facility] to rule of DVT.

## 2024-11-06 NOTE — Discharge Instructions (Addendum)
 Every day while you are on a antibiotic, please use a probiotic and/or a serving of any food with "active cultures" such as activia yogurt, kefir milk, kombucha, or any other product that contains active cultures

## 2024-11-06 NOTE — ED Provider Notes (Signed)
 11:40 PM  Assumed care at shift change.  Patient here with lower extremity cellulitis.  Labs show no leukocytosis, normal electrolytes and glucose.  Ultrasound pending to rule out DVT.  12:34 AM  Pt's DVT ultrasound reviewed and interpreted by myself and the radiologist and shows no DVT.  Patient will be discharged on Keflex and Bactrim  per previous provider.  Prescriptions have already been sent to his pharmacy.   Yacoub Diltz, Josette SAILOR, DO 11/07/24 [phone]

## 2025-02-20 ENCOUNTER — Ambulatory Visit: Admitting: Family Medicine
